# Patient Record
Sex: Female | Born: 1965 | Race: White | Hispanic: No | Marital: Married | Smoking: Never smoker
Health system: Southern US, Community
[De-identification: ages and names within clinical notes are randomized; demographics above are authoritative.]

## PROBLEM LIST (undated history)

## (undated) DIAGNOSIS — H43391 Other vitreous opacities, right eye: Secondary | ICD-10-CM

## (undated) DIAGNOSIS — F419 Anxiety disorder, unspecified: Secondary | ICD-10-CM

## (undated) DIAGNOSIS — M1990 Unspecified osteoarthritis, unspecified site: Secondary | ICD-10-CM

## (undated) DIAGNOSIS — K219 Gastro-esophageal reflux disease without esophagitis: Secondary | ICD-10-CM

## (undated) DIAGNOSIS — T7840XA Allergy, unspecified, initial encounter: Secondary | ICD-10-CM

## (undated) DIAGNOSIS — E785 Hyperlipidemia, unspecified: Secondary | ICD-10-CM

## (undated) DIAGNOSIS — J45909 Unspecified asthma, uncomplicated: Secondary | ICD-10-CM

## (undated) DIAGNOSIS — C801 Malignant (primary) neoplasm, unspecified: Secondary | ICD-10-CM

## (undated) DIAGNOSIS — F32A Depression, unspecified: Secondary | ICD-10-CM

## (undated) DIAGNOSIS — F329 Major depressive disorder, single episode, unspecified: Secondary | ICD-10-CM

## (undated) HISTORY — DX: Unspecified osteoarthritis, unspecified site: M19.90

## (undated) HISTORY — PX: OTHER SURGICAL HISTORY: SHX169

## (undated) HISTORY — DX: Hyperlipidemia, unspecified: E78.5

## (undated) HISTORY — DX: Anxiety disorder, unspecified: F41.9

## (undated) HISTORY — DX: Malignant (primary) neoplasm, unspecified: C80.1

## (undated) HISTORY — DX: Allergy, unspecified, initial encounter: T78.40XA

## (undated) HISTORY — DX: Gastro-esophageal reflux disease without esophagitis: K21.9

## (undated) HISTORY — DX: Depression, unspecified: F32.A

## (undated) HISTORY — PX: HYSTEROSCOPY: SHX211

## (undated) HISTORY — DX: Unspecified asthma, uncomplicated: J45.909

## (undated) HISTORY — DX: Major depressive disorder, single episode, unspecified: F32.9

---

## 1997-12-16 ENCOUNTER — Other Ambulatory Visit: Admission: RE | Admit: 1997-12-16 | Discharge: 1997-12-16 | Payer: Self-pay | Admitting: Obstetrics and Gynecology

## 1998-12-25 ENCOUNTER — Other Ambulatory Visit: Admission: RE | Admit: 1998-12-25 | Discharge: 1998-12-25 | Payer: Self-pay | Admitting: Obstetrics and Gynecology

## 2000-01-01 ENCOUNTER — Other Ambulatory Visit: Admission: RE | Admit: 2000-01-01 | Discharge: 2000-01-01 | Payer: Self-pay | Admitting: Obstetrics and Gynecology

## 2000-12-31 ENCOUNTER — Other Ambulatory Visit: Admission: RE | Admit: 2000-12-31 | Discharge: 2000-12-31 | Payer: Self-pay | Admitting: *Deleted

## 2008-05-30 ENCOUNTER — Inpatient Hospital Stay (HOSPITAL_COMMUNITY): Admission: AD | Admit: 2008-05-30 | Discharge: 2008-05-30 | Payer: Self-pay | Admitting: Obstetrics & Gynecology

## 2008-05-31 ENCOUNTER — Inpatient Hospital Stay (HOSPITAL_COMMUNITY): Admission: AD | Admit: 2008-05-31 | Discharge: 2008-05-31 | Payer: Self-pay | Admitting: Obstetrics

## 2008-06-20 ENCOUNTER — Inpatient Hospital Stay (HOSPITAL_COMMUNITY): Admission: AD | Admit: 2008-06-20 | Discharge: 2008-06-23 | Payer: Self-pay | Admitting: Obstetrics and Gynecology

## 2009-09-06 ENCOUNTER — Encounter: Admission: RE | Admit: 2009-09-06 | Discharge: 2009-09-06 | Payer: Self-pay | Admitting: Obstetrics

## 2010-09-18 ENCOUNTER — Encounter
Admission: RE | Admit: 2010-09-18 | Discharge: 2010-09-18 | Payer: Self-pay | Source: Home / Self Care | Attending: Obstetrics | Admitting: Obstetrics

## 2010-12-25 ENCOUNTER — Other Ambulatory Visit: Payer: Self-pay | Admitting: Orthopedic Surgery

## 2010-12-25 DIAGNOSIS — M545 Low back pain: Secondary | ICD-10-CM

## 2010-12-27 ENCOUNTER — Ambulatory Visit
Admission: RE | Admit: 2010-12-27 | Discharge: 2010-12-27 | Disposition: A | Discharge status: Home / Self Care | Payer: BC Managed Care – PPO | Source: Ambulatory Visit | Attending: Orthopedic Surgery | Admitting: Orthopedic Surgery

## 2010-12-27 DIAGNOSIS — M545 Low back pain: Secondary | ICD-10-CM

## 2011-01-22 NOTE — H&P (Signed)
Emily Ferguson, Emily Ferguson            ACCOUNT NO.:  [id number]   MEDICAL RECORD NO.:  [id number]          PATIENT TYPE:  INP   LOCATION:  9165                          FACILITY:  [facility]   PHYSICIAN:  Lenoard Aden, M.D.DATE OF BIRTH:  1965/11/08   DATE OF ADMISSION:  06/20/2008  DATE OF DISCHARGE:                              HISTORY & PHYSICAL   CHIEF COMPLAINT:  Spontaneous rupture of membranes at 35 weeks with  breech presentation and active labor.   She is a 45 year old white female G2, P0 at 14 weeks' gestation who  presents with spontaneous rupture of membranes today at 12:00 noon  confirmed a breech presentation and active contractions.   ALLERGIES:  She has allergies to LATEX.   MEDICAL HISTORY:  She has past medical history of asthma, reflux,  migraine headaches, infertility.   SURGICAL HISTORY:  Septum section done hysteroscopically.   SOCIAL HISTORY:  Noncontributory.   MEDICATIONS:  Protonix and prenatal vitamins.   FAMILY HISTORY:  Heart disease, stroke, interstitial cystitis and skin  cancer.   PHYSICAL EXAMINATION:  GENERAL:  She is well-developed, well-nourished  white female in no acute distress.  HEENT:  Normal.  LUNGS: Clear.  HEART:  Regular rhythm.  ABDOMEN:  Soft, gravid, and nontender.  __________bedside ultrasound  consistent with breech presentation __________ right maternal upper  quadrant.  CERVICAL:  Deferred.  EXTREMITIES:  No cords.  NEURO:  Nonfocal.  SKIN:  Intact.  Gross rupture of membranes confirmed by leakage of  amniotic fluid as done in the office.   IMPRESSION:  Spontaneous rupture of membranes, breech presentation,  active labor at 35 weeks.   PLAN:  Proceed with C-section.  Risks of anesthesia, infection,  bleeding, injury of the abdominal organs, need for further surgery were  discussed.  Delayed versus immediate complications to include bowel and  bladder injury noted.  The patient acknowledges and wishes to  proceed.      Lenoard Aden, M.D.  Electronically Signed     RJT/MEDQ  D:  06/20/2008  T:  06/21/2008  Job:  [id number]

## 2011-01-22 NOTE — Op Note (Signed)
Emily Ferguson, Emily Ferguson            ACCOUNT NO.:  [id number]   MEDICAL RECORD NO.:  [id number]          PATIENT TYPE:  INP   LOCATION:  9165                          FACILITY:  [facility]   PHYSICIAN:  Lenoard Aden, M.D.DATE OF BIRTH:  07/22/1966   DATE OF PROCEDURE:  DATE OF DISCHARGE:                               OPERATIVE REPORT   PREOPERATIVE DIAGNOSES:  35 weeks breech presentation, spontaneous  rupture of membranes.   POSTOPERATIVE DIAGNOSES:  35 weeks breech presentation, spontaneous  rupture of membranes.   PROCEDURE:  Primary low segment transverse cesarean section.   SURGEON:  Lenoard Aden, MD   ANESTHESIA:  Spinal by Raul Del, MD   ESTIMATED BLOOD LOSS:  1000 mL.   COMPLICATIONS:  None.   DRAINS:  Foley.   COUNTS:  Correct.   The patient recovered in good condition.   FINDINGS:  Full-term living frank breech fetus, Apgars 8 and 9, normal  uterus, normal ovaries, left paratubal cyst, normal intrauterine cavity,  2-layer closure.   BRIEF OPERATIVE NOTE:  We appraised the risks of anesthesia, infection,  bleeding, injury to abdominal organs, need for repair, delayed versus  immediate complications include bowel and bladder injury.  The patient  was brought to the operating room where she was administered a spinal  anesthetic without complications, prepped and draped in usual sterile  fashion.  Foley catheter placed achieving adequate anesthesia, dilute  Marcaine solution, placed beneath the skin incision made with the  scalpel, carried down the fascia which is nicked in the midline and  opened transversely using Mayo scissors.  Rectus muscles was separated  sharply in the midline.  Peritoneum entered sharply.  Bladder blade  placed.  Visceral peritoneum scored sharply off the lower uterine  segment.  Kerr hysterotomy incision made.  The atraumatic delivery of  full term living female, frank breech using the usual maneuvers with  flexion of the fetal  vertex, handed to pediatricians attendance.  Apgars  8 and 9.  Cord blood collected.  Placenta delivered manually intact from  an anterior location.  Uterus explored and found to be normal.  Uterus  was then curetted using a dry lap pack and closed in 2 running  imbricating layers of 0 Monocryl suture.  Bladder flap inspected, found to be hemostatic.  Irrigation  accomplished.  Fascia then reapproximated using 0 Monocryl in running  fashion and skin closed using staples.  The patient tolerated the  procedure well and transferred to recovery in good condition.      Lenoard Aden, M.D.  Electronically Signed     RJT/MEDQ  D:  06/20/2008  T:  06/21/2008  Job:  [id number]

## 2011-01-25 NOTE — Discharge Summary (Signed)
NAMEKANNA, DAFOE            ACCOUNT NO.:  [id number]   MEDICAL RECORD NO.:  [id number]          PATIENT TYPE:  INP   LOCATION:  9104                          FACILITY:  [facility]   PHYSICIAN:  Lenoard Aden, M.D.DATE OF BIRTH:  05/06/1966   DATE OF ADMISSION:  06/20/2008  DATE OF DISCHARGE:  06/23/2008                               DISCHARGE SUMMARY   The patient underwent uncomplicated primary C-section.  Postoperative  course uncomplicated.  Discharged home on postop day #3. Discharge  teaching done.   DISCHARGE MEDICATIONS:  1. Prenatal vitamins.  2. Iron.  3. Tylox.   FOLLOWUP:  Follow up in the office 4-6 weeks.      Lenoard Aden, M.D.  Electronically Signed     RJT/MEDQ  D:  08/02/2008  T:  08/02/2008  Job:  [id number]

## 2011-06-11 LAB — CBC
HCT: 31.8 — ABNORMAL LOW
Hemoglobin: 10.9 — ABNORMAL LOW
Hemoglobin: 12.8
MCHC: 33.5
Platelets: 158
RBC: 3.37 — ABNORMAL LOW
RBC: 4.11
WBC: 12 — ABNORMAL HIGH
WBC: 9.7

## 2011-06-11 LAB — RPR: RPR Ser Ql: NONREACTIVE

## 2011-09-11 ENCOUNTER — Other Ambulatory Visit: Payer: Self-pay | Admitting: Obstetrics

## 2011-09-11 DIAGNOSIS — Z1231 Encounter for screening mammogram for malignant neoplasm of breast: Secondary | ICD-10-CM

## 2011-10-15 ENCOUNTER — Ambulatory Visit
Admission: RE | Admit: 2011-10-15 | Discharge: 2011-10-15 | Disposition: A | Discharge status: Home / Self Care | Payer: BC Managed Care – PPO | Source: Ambulatory Visit | Attending: Obstetrics | Admitting: Obstetrics

## 2011-10-15 DIAGNOSIS — Z1231 Encounter for screening mammogram for malignant neoplasm of breast: Secondary | ICD-10-CM

## 2012-10-16 ENCOUNTER — Other Ambulatory Visit: Payer: Self-pay | Admitting: Obstetrics

## 2012-10-16 DIAGNOSIS — Z1231 Encounter for screening mammogram for malignant neoplasm of breast: Secondary | ICD-10-CM

## 2012-11-18 ENCOUNTER — Ambulatory Visit
Admission: RE | Admit: 2012-11-18 | Discharge: 2012-11-18 | Disposition: A | Discharge status: Home / Self Care | Payer: PRIVATE HEALTH INSURANCE | Source: Ambulatory Visit | Attending: Obstetrics | Admitting: Obstetrics

## 2013-11-15 ENCOUNTER — Other Ambulatory Visit: Payer: Self-pay

## 2013-11-15 DIAGNOSIS — Z1231 Encounter for screening mammogram for malignant neoplasm of breast: Secondary | ICD-10-CM

## 2013-12-03 ENCOUNTER — Ambulatory Visit
Admission: RE | Admit: 2013-12-03 | Discharge: 2013-12-03 | Disposition: A | Discharge status: Home / Self Care | Payer: PRIVATE HEALTH INSURANCE | Source: Ambulatory Visit

## 2013-12-03 DIAGNOSIS — Z1231 Encounter for screening mammogram for malignant neoplasm of breast: Secondary | ICD-10-CM

## 2014-04-08 ENCOUNTER — Ambulatory Visit
Admission: RE | Admit: 2014-04-08 | Discharge: 2014-04-08 | Disposition: A | Discharge status: Home / Self Care | Payer: BC Managed Care – PPO | Source: Ambulatory Visit | Attending: Sports Medicine | Admitting: Sports Medicine

## 2014-04-08 ENCOUNTER — Encounter: Payer: Self-pay | Admitting: Sports Medicine

## 2014-04-08 ENCOUNTER — Ambulatory Visit (INDEPENDENT_AMBULATORY_CARE_PROVIDER_SITE_OTHER): Payer: BC Managed Care – PPO | Admitting: Sports Medicine

## 2014-04-08 VITALS — BP 116/77 | HR 65 | Ht 67.0 in | Wt 136.0 lb

## 2014-04-08 DIAGNOSIS — R1032 Left lower quadrant pain: Secondary | ICD-10-CM

## 2014-04-08 DIAGNOSIS — M25569 Pain in unspecified knee: Secondary | ICD-10-CM

## 2014-04-08 DIAGNOSIS — M545 Low back pain, unspecified: Secondary | ICD-10-CM

## 2014-04-08 DIAGNOSIS — R109 Unspecified abdominal pain: Secondary | ICD-10-CM

## 2014-04-08 DIAGNOSIS — M25552 Pain in left hip: Secondary | ICD-10-CM

## 2014-04-08 DIAGNOSIS — M25559 Pain in unspecified hip: Secondary | ICD-10-CM

## 2014-04-08 DIAGNOSIS — M25562 Pain in left knee: Secondary | ICD-10-CM | POA: Insufficient documentation

## 2014-04-08 MED ORDER — METAXALONE 800 MG PO TABS: 800.0000 mg | ORAL_TABLET | Freq: Three times a day (TID) | ORAL | Status: DC | PRN

## 2014-04-08 NOTE — Progress Notes (Signed)
  Emily Ferguson - 48 y.o. female MRN [id number]  Date of birth: February 18, 1966    SUBJECTIVE:     48 year old healthy female presents today with several issues/complaints.  1) Left Groin Pain - Has been present since January. - Pain is intermittent and mild in severity (2-3/10).  She describes the pain as feeling like a cramp. Worse with lying down and sitting.  She states the pain often radiates down her left medial thigh and to the knee.   - No recent fall, trauma, injury.  2) Left knee pain - Has been present for years.  She states that recently worsened her the past 3 weeks after increasing her physical activity (she walking on regular basis). - Pain is located above the kneecap.  No joint line tenderness.  She reports some grinding with motion.  She also notes some popping and clicking.  She states that it often feels like it's going to give way especially when going up steps.  3) Low back pain - Has been present for years. - She had significant issues with her low back approximately 8 years ago.  At that time she had an MRI which revealed an annular tear at L4-L5. - Currently, she states that her low back bothers her intermittently.  She reports that her back often "gives out", typically 3-4 times a year.  After one to one and a half weeks it improves and she is back doing her regular activities. - She denies radicular symptoms (numbness, tingling).  ROS:     Per HPI  PERTINENT  PMH / PSH  / SH:  Past Medical, Surgical, and Social Hx Reviewed.   Pertinent Historical Findings include:  Prior annular tear ~ 8 years ago.   OBJECTIVE: BP 116/77  Pulse 65  Ht 5\' 7"  (1.702 m)  Wt 136 lb (61.689 kg)  BMI 21.30 kg/m2  Physical Exam:  Vital signs are reviewed. General: Well-appearing female in no acute distress. MSK: Knee: Left Normal to inspection with no erythema or effusion or obvious bony abnormalities. Palpation normal with no warmth, joint line tenderness, patellar  tenderness, or condyle tenderness. ROM full in flexion and extension and lower leg rotation. Ligaments with solid consistent endpoints including ACL, PCL, LCL, MCL. Negative Mcmurray's.  Non painful patellar compression. Patellar glide with minimal crepitus. Patellar and quadriceps tendons unremarkable. Quad strength 4/5.   Hip: Left ROM - Full ROM in all planes except ER which was slightly decreased.  Muscle Strength - Normal except abduction 4/5. Pelvic alignment unremarkable to inspection and palpation. Greater trochanter without tenderness to palpation. No tenderness over piriformis and greater trochanter. No pain with FADIR. + Pain with FABER. No SI joint tenderness.  Back Exam: Inspection: Normal Motion: Decreased ROM w/ extension/  Negative Straight leg raise. Good hamstring flexibility  X-rays of her lumbar spine, left hip, and left knee were all ordered and reviewed today. All films are unremarkable. No significant degenerative changes seen. Nothing acute on any films.  ASSESSMENT & PLAN: See problem based charting & AVS for pt instructions.

## 2014-04-08 NOTE — Assessment & Plan Note (Addendum)
  Muscle relaxant PRN for spasm. Refer to [facility]. Followup with me in 6 weeks

## 2014-04-08 NOTE — Assessment & Plan Note (Addendum)
Unclear etiology. Question iliopsoas tendinitis. Referral to [facility]. Followup with me in 6 weeks.

## 2014-04-08 NOTE — Assessment & Plan Note (Addendum)
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 Normal physical exam today. Referred to Emily Ferguson Followup in 6 weeks

## 2014-04-08 NOTE — Patient Instructions (Signed)
Await Dr. Alinda Money call about the xrays.  Schedule PT following his phone call.  Follow up in 6 weeks.

## 2014-04-11 ENCOUNTER — Telehealth: Payer: Self-pay | Admitting: *Deleted

## 2014-04-11 NOTE — Telephone Encounter (Signed)
Message copied by Laurey Arrow on Mon Apr 11, 2014  3:45 PM ------      Message from: Thurman Coyer      Created: Fri Apr 08, 2014  4:50 PM      Regarding: xrays       Please call her and tell her that the x-rays of her low back, hip, and knee are all normal. She has no arthritis and no significant degenerative disc disease. Tell her to go ahead and contact the physical therapist, Barbaraann Barthel, and set up her first appointment with him. She should followup with me in about 6 weeks.            ----- Message -----         From: Rad Results In Interface         Sent: 04/08/2014   1:19 PM           To: Thurman Coyer, DO                   ------

## 2014-04-14 ENCOUNTER — Telehealth: Payer: Self-pay | Admitting: *Deleted

## 2014-04-14 NOTE — Telephone Encounter (Signed)
Message copied by Laurey Arrow on Thu Apr 14, 2014  9:19 AM ------      Message from: Carolyne Littles      Created: Tue Apr 12, 2014  3:43 PM      Regarding: phone message      Contact: [phone]       Would like to discuss med (metaxalone) she said with her insurance it was over 200.00 so she is looking for something else.            She didn't know the name of the med, that was in her chart so I'm guessing that was the one we sent in. ------

## 2014-05-10 ENCOUNTER — Ambulatory Visit: Payer: PRIVATE HEALTH INSURANCE | Admitting: Sports Medicine

## 2014-05-23 ENCOUNTER — Ambulatory Visit: Payer: BC Managed Care – PPO | Admitting: Sports Medicine

## 2014-06-20 ENCOUNTER — Ambulatory Visit: Payer: BC Managed Care – PPO | Admitting: Sports Medicine

## 2014-06-27 ENCOUNTER — Ambulatory Visit (INDEPENDENT_AMBULATORY_CARE_PROVIDER_SITE_OTHER): Payer: BC Managed Care – PPO | Admitting: Sports Medicine

## 2014-06-27 ENCOUNTER — Encounter: Payer: Self-pay | Admitting: Sports Medicine

## 2014-06-27 VITALS — BP 111/71 | HR 65 | Ht 67.0 in | Wt 136.0 lb

## 2014-06-27 DIAGNOSIS — M545 Low back pain, unspecified: Secondary | ICD-10-CM

## 2014-06-27 DIAGNOSIS — M25552 Pain in left hip: Secondary | ICD-10-CM

## 2014-06-27 DIAGNOSIS — M25562 Pain in left knee: Secondary | ICD-10-CM

## 2014-06-27 MED ORDER — CYCLOBENZAPRINE HCL 5 MG PO TABS: 5.0000 mg | ORAL_TABLET | Freq: Every day | ORAL | Status: DC | PRN

## 2014-06-27 NOTE — Progress Notes (Signed)
  JANIECE SCOVILL - 48 y.o. female MRN [id number]  Date of birth: 08-10-1966    SUBJECTIVE:     Ms. Bodkins is a 48 yo F presenting for f/u of her right sided lumbago, left hip pain and left knee pain. She was referred to PT Barbaraann Barthel at previous appt.   Back: she reports feeling greatly improved. She was doing some heavy lifting yesterday and felt sore. She took an aleve and that resolved her symptoms. She denise any flare ups. She is 95% better since working with PT. She had some associated pain with her cycle but that is normal for her.   Left hip pain: she is 80-85% better since working with PT. Pain occurs on her left lateral hip. Localized in nature. The pain is occuring left frequently. She is not taking any medications. Sitting in certain situations exacerbates her pain so she avoids sitting in those positions. She had one flare and took her mother's 5 mg flexeril and that resolved her pain.   Left knee: having pain with moderate activity. Able to play tennis two times per week now. She was feeling pain with every third step prior to PT. The pain is not significant and feels it most under the knee cap. She reports being 80-85% better.   Denies any fevers, chills, night sweats. No weakness, numbness or tingling.   ROS:     See HPI   OBJECTIVE: BP 111/71  Pulse 65  Ht 5\' 7"  (1.702 m)  Wt 136 lb (61.689 kg)  BMI 21.30 kg/m2  Physical Exam:  Vital signs are reviewed. General: NAD, alert, well appearing female  Back:  Appearance: sciolosis no, no erythema or ecchymosis  Palpation: tenderness of paraspinal muscles no, spinous process no; pelvis no  Flexion: FROM  Extension: FROM Hip:  Appearance: symmetric, no erythema or ecchymosis  Palpation: tenderness of greater trochanter no Rotation Reduced: internal no, external no FADIR: pos on left,  FABER: nml b/l  Neuro: Strength hip flexion 5/5,knee extension 5/5, knee flexion 5/5, dorsiflexion 5/5, plantar flexion  5/5 Reflexes: patella 2/2 Bilateral   Straight Leg Raise: negative Sensation to light touch intact: yes Knee Exam:  Laterality: left Appearance: no effusion, erythema, ecchymosis  Edema: no   Tenderness: no   Range of Motion: Passive Extension: normal Flexion:normal Active Extension: normal Flexion: normal Laxity: none Maneuvers:  McMurray's: neg Patellar Compression: neg Strength:  Quadricep: 5/5 Hamstring: 5/5 Gait: normal Neurovascularly intact   ASSESSMENT & PLAN:  See problem based charting.

## 2014-06-27 NOTE — Assessment & Plan Note (Addendum)
Pain on lateral left hip. 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 Greatly improved with PT.  - f/u with PT in one month.  - f/u [facility] PRN

## 2014-06-27 NOTE — Assessment & Plan Note (Signed)
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 Improved with PT.  - continue home modalities.  - has f/u with PT in one month  - f/u [facility] PRN

## 2014-06-27 NOTE — Assessment & Plan Note (Signed)
Resolved since working with PT.  - graduated from PT with f/u in one month  - continue home modalities  - f/u PRN  - flexeril 5 mg QD PRN

## 2015-03-16 ENCOUNTER — Ambulatory Visit
Admission: RE | Admit: 2015-03-16 | Discharge: 2015-03-16 | Disposition: A | Discharge status: Home / Self Care | Payer: BLUE CROSS/BLUE SHIELD | Source: Ambulatory Visit | Attending: Allergy and Immunology | Admitting: Allergy and Immunology

## 2015-03-16 ENCOUNTER — Other Ambulatory Visit: Payer: Self-pay | Admitting: Allergy and Immunology

## 2015-03-16 DIAGNOSIS — J4520 Mild intermittent asthma, uncomplicated: Secondary | ICD-10-CM

## 2015-05-22 ENCOUNTER — Other Ambulatory Visit: Payer: Self-pay | Admitting: Otolaryngology

## 2015-05-22 DIAGNOSIS — R0981 Nasal congestion: Secondary | ICD-10-CM

## 2015-05-22 DIAGNOSIS — R51 Headache: Secondary | ICD-10-CM

## 2015-05-22 DIAGNOSIS — R519 Headache, unspecified: Secondary | ICD-10-CM

## 2015-05-26 ENCOUNTER — Ambulatory Visit
Admission: RE | Admit: 2015-05-26 | Discharge: 2015-05-26 | Disposition: A | Discharge status: Home / Self Care | Payer: BLUE CROSS/BLUE SHIELD | Source: Ambulatory Visit | Attending: Otolaryngology | Admitting: Otolaryngology

## 2015-05-26 DIAGNOSIS — R519 Headache, unspecified: Secondary | ICD-10-CM

## 2015-05-26 DIAGNOSIS — R51 Headache: Secondary | ICD-10-CM

## 2015-05-26 DIAGNOSIS — R0981 Nasal congestion: Secondary | ICD-10-CM

## 2016-02-19 DIAGNOSIS — J301 Allergic rhinitis due to pollen: Secondary | ICD-10-CM | POA: Diagnosis not present

## 2016-02-19 DIAGNOSIS — J3089 Other allergic rhinitis: Secondary | ICD-10-CM | POA: Diagnosis not present

## 2016-02-20 DIAGNOSIS — N920 Excessive and frequent menstruation with regular cycle: Secondary | ICD-10-CM | POA: Diagnosis not present

## 2016-02-20 DIAGNOSIS — N921 Excessive and frequent menstruation with irregular cycle: Secondary | ICD-10-CM | POA: Diagnosis not present

## 2016-03-01 DIAGNOSIS — J301 Allergic rhinitis due to pollen: Secondary | ICD-10-CM | POA: Diagnosis not present

## 2016-03-01 DIAGNOSIS — J3089 Other allergic rhinitis: Secondary | ICD-10-CM | POA: Diagnosis not present

## 2016-03-06 DIAGNOSIS — L710 Perioral dermatitis: Secondary | ICD-10-CM | POA: Diagnosis not present

## 2016-03-15 DIAGNOSIS — J301 Allergic rhinitis due to pollen: Secondary | ICD-10-CM | POA: Diagnosis not present

## 2016-03-15 DIAGNOSIS — J3089 Other allergic rhinitis: Secondary | ICD-10-CM | POA: Diagnosis not present

## 2016-03-20 DIAGNOSIS — J3089 Other allergic rhinitis: Secondary | ICD-10-CM | POA: Diagnosis not present

## 2016-03-20 DIAGNOSIS — J301 Allergic rhinitis due to pollen: Secondary | ICD-10-CM | POA: Diagnosis not present

## 2016-03-26 DIAGNOSIS — J301 Allergic rhinitis due to pollen: Secondary | ICD-10-CM | POA: Diagnosis not present

## 2016-03-26 DIAGNOSIS — J3089 Other allergic rhinitis: Secondary | ICD-10-CM | POA: Diagnosis not present

## 2016-03-27 DIAGNOSIS — L700 Acne vulgaris: Secondary | ICD-10-CM | POA: Diagnosis not present

## 2016-03-29 ENCOUNTER — Other Ambulatory Visit: Payer: Self-pay | Admitting: *Deleted

## 2016-03-29 DIAGNOSIS — M545 Low back pain, unspecified: Secondary | ICD-10-CM

## 2016-03-29 DIAGNOSIS — M25552 Pain in left hip: Secondary | ICD-10-CM

## 2016-03-29 MED ORDER — METAXALONE 800 MG PO TABS
800.0000 mg | ORAL_TABLET | Freq: Two times a day (BID) | ORAL | Status: DC | PRN
Start: 2016-03-29 — End: 2016-09-30

## 2016-03-29 MED ORDER — CYCLOBENZAPRINE HCL 5 MG PO TABS: 5.0000 mg | ORAL_TABLET | Freq: Every day | ORAL | Status: DC

## 2016-04-08 DIAGNOSIS — J301 Allergic rhinitis due to pollen: Secondary | ICD-10-CM | POA: Diagnosis not present

## 2016-04-08 DIAGNOSIS — J3089 Other allergic rhinitis: Secondary | ICD-10-CM | POA: Diagnosis not present

## 2016-04-11 DIAGNOSIS — J301 Allergic rhinitis due to pollen: Secondary | ICD-10-CM | POA: Diagnosis not present

## 2016-04-16 DIAGNOSIS — J301 Allergic rhinitis due to pollen: Secondary | ICD-10-CM | POA: Diagnosis not present

## 2016-04-16 DIAGNOSIS — J3089 Other allergic rhinitis: Secondary | ICD-10-CM | POA: Diagnosis not present

## 2016-05-06 DIAGNOSIS — J3089 Other allergic rhinitis: Secondary | ICD-10-CM | POA: Diagnosis not present

## 2016-05-06 DIAGNOSIS — J301 Allergic rhinitis due to pollen: Secondary | ICD-10-CM | POA: Diagnosis not present

## 2016-05-20 DIAGNOSIS — J039 Acute tonsillitis, unspecified: Secondary | ICD-10-CM | POA: Diagnosis not present

## 2016-05-20 DIAGNOSIS — J309 Allergic rhinitis, unspecified: Secondary | ICD-10-CM | POA: Diagnosis not present

## 2016-05-20 DIAGNOSIS — G43829 Menstrual migraine, not intractable, without status migrainosus: Secondary | ICD-10-CM | POA: Diagnosis not present

## 2016-05-20 DIAGNOSIS — J0101 Acute recurrent maxillary sinusitis: Secondary | ICD-10-CM | POA: Diagnosis not present

## 2016-05-20 DIAGNOSIS — Z9189 Other specified personal risk factors, not elsewhere classified: Secondary | ICD-10-CM | POA: Diagnosis not present

## 2016-05-22 DIAGNOSIS — J3089 Other allergic rhinitis: Secondary | ICD-10-CM | POA: Diagnosis not present

## 2016-05-22 DIAGNOSIS — J301 Allergic rhinitis due to pollen: Secondary | ICD-10-CM | POA: Diagnosis not present

## 2016-05-27 DIAGNOSIS — J3089 Other allergic rhinitis: Secondary | ICD-10-CM | POA: Diagnosis not present

## 2016-05-27 DIAGNOSIS — J301 Allergic rhinitis due to pollen: Secondary | ICD-10-CM | POA: Diagnosis not present

## 2016-05-28 DIAGNOSIS — J3089 Other allergic rhinitis: Secondary | ICD-10-CM | POA: Diagnosis not present

## 2016-06-04 DIAGNOSIS — J301 Allergic rhinitis due to pollen: Secondary | ICD-10-CM | POA: Diagnosis not present

## 2016-06-04 DIAGNOSIS — J3089 Other allergic rhinitis: Secondary | ICD-10-CM | POA: Diagnosis not present

## 2016-06-10 DIAGNOSIS — J3089 Other allergic rhinitis: Secondary | ICD-10-CM | POA: Diagnosis not present

## 2016-06-10 DIAGNOSIS — J301 Allergic rhinitis due to pollen: Secondary | ICD-10-CM | POA: Diagnosis not present

## 2016-06-12 DIAGNOSIS — J4530 Mild persistent asthma, uncomplicated: Secondary | ICD-10-CM | POA: Diagnosis not present

## 2016-06-12 DIAGNOSIS — J3089 Other allergic rhinitis: Secondary | ICD-10-CM | POA: Diagnosis not present

## 2016-06-12 DIAGNOSIS — J301 Allergic rhinitis due to pollen: Secondary | ICD-10-CM | POA: Diagnosis not present

## 2016-06-24 DIAGNOSIS — Z23 Encounter for immunization: Secondary | ICD-10-CM | POA: Diagnosis not present

## 2016-06-24 DIAGNOSIS — J3089 Other allergic rhinitis: Secondary | ICD-10-CM | POA: Diagnosis not present

## 2016-06-24 DIAGNOSIS — J301 Allergic rhinitis due to pollen: Secondary | ICD-10-CM | POA: Diagnosis not present

## 2016-07-01 DIAGNOSIS — J301 Allergic rhinitis due to pollen: Secondary | ICD-10-CM | POA: Diagnosis not present

## 2016-07-01 DIAGNOSIS — J3089 Other allergic rhinitis: Secondary | ICD-10-CM | POA: Diagnosis not present

## 2016-07-12 DIAGNOSIS — Z0000 Encounter for general adult medical examination without abnormal findings: Secondary | ICD-10-CM | POA: Diagnosis not present

## 2016-07-24 ENCOUNTER — Other Ambulatory Visit: Payer: Self-pay | Admitting: Internal Medicine

## 2016-07-24 DIAGNOSIS — R2232 Localized swelling, mass and lump, left upper limb: Secondary | ICD-10-CM | POA: Diagnosis not present

## 2016-07-24 DIAGNOSIS — J329 Chronic sinusitis, unspecified: Secondary | ICD-10-CM | POA: Diagnosis not present

## 2016-07-24 DIAGNOSIS — Z0000 Encounter for general adult medical examination without abnormal findings: Secondary | ICD-10-CM | POA: Diagnosis not present

## 2016-07-24 DIAGNOSIS — N6320 Unspecified lump in the left breast, unspecified quadrant: Secondary | ICD-10-CM

## 2016-07-30 ENCOUNTER — Ambulatory Visit
Admission: RE | Admit: 2016-07-30 | Discharge: 2016-07-30 | Disposition: A | Discharge status: Home / Self Care | Payer: BLUE CROSS/BLUE SHIELD | Source: Ambulatory Visit | Attending: Internal Medicine | Admitting: Internal Medicine

## 2016-07-30 ENCOUNTER — Other Ambulatory Visit: Payer: Self-pay | Admitting: Internal Medicine

## 2016-07-30 DIAGNOSIS — N6320 Unspecified lump in the left breast, unspecified quadrant: Secondary | ICD-10-CM

## 2016-07-30 DIAGNOSIS — R590 Localized enlarged lymph nodes: Secondary | ICD-10-CM

## 2016-08-07 DIAGNOSIS — J301 Allergic rhinitis due to pollen: Secondary | ICD-10-CM | POA: Diagnosis not present

## 2016-08-07 DIAGNOSIS — J3089 Other allergic rhinitis: Secondary | ICD-10-CM | POA: Diagnosis not present

## 2016-08-09 DIAGNOSIS — J3089 Other allergic rhinitis: Secondary | ICD-10-CM | POA: Diagnosis not present

## 2016-08-09 DIAGNOSIS — J301 Allergic rhinitis due to pollen: Secondary | ICD-10-CM | POA: Diagnosis not present

## 2016-08-12 DIAGNOSIS — J3089 Other allergic rhinitis: Secondary | ICD-10-CM | POA: Diagnosis not present

## 2016-08-12 DIAGNOSIS — J301 Allergic rhinitis due to pollen: Secondary | ICD-10-CM | POA: Diagnosis not present

## 2016-08-13 DIAGNOSIS — N920 Excessive and frequent menstruation with regular cycle: Secondary | ICD-10-CM | POA: Diagnosis not present

## 2016-08-19 DIAGNOSIS — J301 Allergic rhinitis due to pollen: Secondary | ICD-10-CM | POA: Diagnosis not present

## 2016-08-19 DIAGNOSIS — J3089 Other allergic rhinitis: Secondary | ICD-10-CM | POA: Diagnosis not present

## 2016-08-22 DIAGNOSIS — J301 Allergic rhinitis due to pollen: Secondary | ICD-10-CM | POA: Diagnosis not present

## 2016-08-22 DIAGNOSIS — J3089 Other allergic rhinitis: Secondary | ICD-10-CM | POA: Diagnosis not present

## 2016-09-06 DIAGNOSIS — J301 Allergic rhinitis due to pollen: Secondary | ICD-10-CM | POA: Diagnosis not present

## 2016-09-06 DIAGNOSIS — J3089 Other allergic rhinitis: Secondary | ICD-10-CM | POA: Diagnosis not present

## 2016-09-13 DIAGNOSIS — J301 Allergic rhinitis due to pollen: Secondary | ICD-10-CM | POA: Diagnosis not present

## 2016-09-13 DIAGNOSIS — J3089 Other allergic rhinitis: Secondary | ICD-10-CM | POA: Diagnosis not present

## 2016-09-18 DIAGNOSIS — J3089 Other allergic rhinitis: Secondary | ICD-10-CM | POA: Diagnosis not present

## 2016-09-18 DIAGNOSIS — M542 Cervicalgia: Secondary | ICD-10-CM | POA: Diagnosis not present

## 2016-09-18 DIAGNOSIS — J301 Allergic rhinitis due to pollen: Secondary | ICD-10-CM | POA: Diagnosis not present

## 2016-09-19 ENCOUNTER — Other Ambulatory Visit: Payer: Self-pay | Admitting: *Deleted

## 2016-09-19 DIAGNOSIS — G8929 Other chronic pain: Secondary | ICD-10-CM

## 2016-09-19 DIAGNOSIS — M545 Low back pain: Principal | ICD-10-CM

## 2016-09-24 DIAGNOSIS — M542 Cervicalgia: Secondary | ICD-10-CM | POA: Diagnosis not present

## 2016-09-26 ENCOUNTER — Ambulatory Visit: Payer: BLUE CROSS/BLUE SHIELD | Admitting: Sports Medicine

## 2016-09-30 ENCOUNTER — Ambulatory Visit (INDEPENDENT_AMBULATORY_CARE_PROVIDER_SITE_OTHER): Payer: BLUE CROSS/BLUE SHIELD | Admitting: Sports Medicine

## 2016-09-30 ENCOUNTER — Encounter: Payer: Self-pay | Admitting: Sports Medicine

## 2016-09-30 DIAGNOSIS — G8929 Other chronic pain: Secondary | ICD-10-CM

## 2016-09-30 DIAGNOSIS — M62838 Other muscle spasm: Secondary | ICD-10-CM

## 2016-09-30 DIAGNOSIS — M545 Low back pain, unspecified: Secondary | ICD-10-CM

## 2016-09-30 MED ORDER — CYCLOBENZAPRINE HCL 5 MG PO TABS: 5.0000 mg | ORAL_TABLET | Freq: Every day | ORAL | 0 refills | Status: DC

## 2016-09-30 MED ORDER — METAXALONE 800 MG PO TABS: 800.0000 mg | ORAL_TABLET | Freq: Two times a day (BID) | ORAL | 0 refills | Status: DC | PRN

## 2016-09-30 NOTE — Progress Notes (Signed)
   Subjective:    Patient ID: Delos Haring, female    DOB: [birth year], 51 y.o.   MRN: [id number]  HPI chief complaint: Med refills  51 year old female comes in today for refills on both Flexeril and Skelaxin. She takes both of these intermittently. She takes Flexeril for severe spasm and Skelaxin for mild to moderate spasm of her low back. She does not take them together. She still has several pills left over from her last refill. She has a history of intermittent low back pain and left hip pain which has responded in the past to formal physical therapy. She has restarted physical therapy with Barbaraann Barthel and that is helping. For the most part she is able to stay active without any problem but will occasionally get some spasm in her low back. Her spasms respond very well to intermittent use of both Flexeril and Skelaxin. She denies any numbness or tingling. No nighttime pain. No weight loss. No fevers or chills. No trauma.  Interim medical history reviewed Medications reviewed Allergies reviewed    Review of Systems    as above Objective:   Physical Exam  Well-developed, well-nourished. No acute distress. Awake alert and oriented 3. Vital signs reviewed  Lumbar spine: Full painless lumbar range of motion. No spasm. No tenderness to palpation.  Left hip: Smooth painless hip range of motion with a negative logroll. No tenderness to palpation along the lateral hip.  Neurological exam: No focal neurological deficit of either lower extremity      Assessment & Plan:   Intermittent low back spasm  Patient is using her Flexeril and Skelaxin responsibly. I've given her refills on both of these. She understands not to take them together. She understands the importance of continuing with her home exercises once she is discharged from physical therapy. I think she is okay to continue with activity as tolerated using pain as her guide. I do not see the need for diagnostic imaging at  this time. Patient will follow-up as needed.

## 2016-10-01 DIAGNOSIS — J301 Allergic rhinitis due to pollen: Secondary | ICD-10-CM | POA: Diagnosis not present

## 2016-10-01 DIAGNOSIS — J3089 Other allergic rhinitis: Secondary | ICD-10-CM | POA: Diagnosis not present

## 2016-10-01 DIAGNOSIS — M542 Cervicalgia: Secondary | ICD-10-CM | POA: Diagnosis not present

## 2016-10-02 DIAGNOSIS — N920 Excessive and frequent menstruation with regular cycle: Secondary | ICD-10-CM | POA: Diagnosis not present

## 2016-10-02 DIAGNOSIS — Z3202 Encounter for pregnancy test, result negative: Secondary | ICD-10-CM | POA: Diagnosis not present

## 2016-10-02 DIAGNOSIS — Z30430 Encounter for insertion of intrauterine contraceptive device: Secondary | ICD-10-CM | POA: Diagnosis not present

## 2016-10-08 DIAGNOSIS — R109 Unspecified abdominal pain: Secondary | ICD-10-CM | POA: Diagnosis not present

## 2016-10-14 DIAGNOSIS — J301 Allergic rhinitis due to pollen: Secondary | ICD-10-CM | POA: Diagnosis not present

## 2016-10-14 DIAGNOSIS — J3089 Other allergic rhinitis: Secondary | ICD-10-CM | POA: Diagnosis not present

## 2016-10-30 DIAGNOSIS — J4520 Mild intermittent asthma, uncomplicated: Secondary | ICD-10-CM | POA: Diagnosis not present

## 2016-10-30 DIAGNOSIS — J111 Influenza due to unidentified influenza virus with other respiratory manifestations: Secondary | ICD-10-CM | POA: Diagnosis not present

## 2016-11-04 DIAGNOSIS — J3089 Other allergic rhinitis: Secondary | ICD-10-CM | POA: Diagnosis not present

## 2016-11-04 DIAGNOSIS — J301 Allergic rhinitis due to pollen: Secondary | ICD-10-CM | POA: Diagnosis not present

## 2016-11-21 DIAGNOSIS — J301 Allergic rhinitis due to pollen: Secondary | ICD-10-CM | POA: Diagnosis not present

## 2016-11-21 DIAGNOSIS — J3089 Other allergic rhinitis: Secondary | ICD-10-CM | POA: Diagnosis not present

## 2016-12-04 DIAGNOSIS — J3089 Other allergic rhinitis: Secondary | ICD-10-CM | POA: Diagnosis not present

## 2016-12-04 DIAGNOSIS — J301 Allergic rhinitis due to pollen: Secondary | ICD-10-CM | POA: Diagnosis not present

## 2016-12-18 DIAGNOSIS — J3089 Other allergic rhinitis: Secondary | ICD-10-CM | POA: Diagnosis not present

## 2016-12-18 DIAGNOSIS — J301 Allergic rhinitis due to pollen: Secondary | ICD-10-CM | POA: Diagnosis not present

## 2016-12-23 DIAGNOSIS — C4441 Basal cell carcinoma of skin of scalp and neck: Secondary | ICD-10-CM | POA: Diagnosis not present

## 2016-12-23 DIAGNOSIS — D225 Melanocytic nevi of trunk: Secondary | ICD-10-CM | POA: Diagnosis not present

## 2016-12-23 DIAGNOSIS — D2262 Melanocytic nevi of left upper limb, including shoulder: Secondary | ICD-10-CM | POA: Diagnosis not present

## 2016-12-23 DIAGNOSIS — D2261 Melanocytic nevi of right upper limb, including shoulder: Secondary | ICD-10-CM | POA: Diagnosis not present

## 2016-12-23 DIAGNOSIS — C44319 Basal cell carcinoma of skin of other parts of face: Secondary | ICD-10-CM | POA: Diagnosis not present

## 2016-12-23 DIAGNOSIS — L821 Other seborrheic keratosis: Secondary | ICD-10-CM | POA: Diagnosis not present

## 2017-01-01 DIAGNOSIS — J4521 Mild intermittent asthma with (acute) exacerbation: Secondary | ICD-10-CM | POA: Diagnosis not present

## 2017-01-01 DIAGNOSIS — R5383 Other fatigue: Secondary | ICD-10-CM | POA: Diagnosis not present

## 2017-01-01 DIAGNOSIS — J0100 Acute maxillary sinusitis, unspecified: Secondary | ICD-10-CM | POA: Diagnosis not present

## 2017-01-06 DIAGNOSIS — C44319 Basal cell carcinoma of skin of other parts of face: Secondary | ICD-10-CM | POA: Diagnosis not present

## 2017-01-21 ENCOUNTER — Other Ambulatory Visit: Payer: Self-pay | Admitting: Internal Medicine

## 2017-01-21 DIAGNOSIS — N63 Unspecified lump in unspecified breast: Secondary | ICD-10-CM

## 2017-01-24 ENCOUNTER — Other Ambulatory Visit: Payer: Self-pay | Admitting: Allergy and Immunology

## 2017-01-24 ENCOUNTER — Ambulatory Visit
Admission: RE | Admit: 2017-01-24 | Discharge: 2017-01-24 | Disposition: A | Discharge status: Home / Self Care | Payer: BLUE CROSS/BLUE SHIELD | Source: Ambulatory Visit | Attending: Allergy and Immunology | Admitting: Allergy and Immunology

## 2017-01-24 DIAGNOSIS — J4541 Moderate persistent asthma with (acute) exacerbation: Secondary | ICD-10-CM

## 2017-01-29 ENCOUNTER — Ambulatory Visit
Admission: RE | Admit: 2017-01-29 | Discharge: 2017-01-29 | Disposition: A | Discharge status: Home / Self Care | Payer: BLUE CROSS/BLUE SHIELD | Source: Ambulatory Visit | Attending: Internal Medicine | Admitting: Internal Medicine

## 2017-01-29 DIAGNOSIS — N63 Unspecified lump in unspecified breast: Secondary | ICD-10-CM

## 2017-01-31 ENCOUNTER — Telehealth: Payer: Self-pay | Admitting: *Deleted

## 2017-01-31 MED ORDER — METHOCARBAMOL 750 MG PO TABS: 750.0000 mg | ORAL_TABLET | Freq: Three times a day (TID) | ORAL | 0 refills | Status: DC | PRN

## 2017-01-31 NOTE — Telephone Encounter (Signed)
New medication called in to replace Skelaxin

## 2017-05-08 DIAGNOSIS — J3089 Other allergic rhinitis: Secondary | ICD-10-CM | POA: Diagnosis not present

## 2017-05-08 DIAGNOSIS — J301 Allergic rhinitis due to pollen: Secondary | ICD-10-CM | POA: Diagnosis not present

## 2017-05-28 DIAGNOSIS — J329 Chronic sinusitis, unspecified: Secondary | ICD-10-CM | POA: Diagnosis not present

## 2017-06-19 DIAGNOSIS — J4530 Mild persistent asthma, uncomplicated: Secondary | ICD-10-CM | POA: Diagnosis not present

## 2017-06-19 DIAGNOSIS — J301 Allergic rhinitis due to pollen: Secondary | ICD-10-CM | POA: Diagnosis not present

## 2017-06-19 DIAGNOSIS — Z23 Encounter for immunization: Secondary | ICD-10-CM | POA: Diagnosis not present

## 2017-06-19 DIAGNOSIS — J3089 Other allergic rhinitis: Secondary | ICD-10-CM | POA: Diagnosis not present

## 2017-07-25 DIAGNOSIS — Z0000 Encounter for general adult medical examination without abnormal findings: Secondary | ICD-10-CM | POA: Diagnosis not present

## 2017-07-29 DIAGNOSIS — Z1151 Encounter for screening for human papillomavirus (HPV): Secondary | ICD-10-CM | POA: Diagnosis not present

## 2017-07-29 DIAGNOSIS — Z01419 Encounter for gynecological examination (general) (routine) without abnormal findings: Secondary | ICD-10-CM | POA: Diagnosis not present

## 2017-07-29 DIAGNOSIS — Z6821 Body mass index (BMI) 21.0-21.9, adult: Secondary | ICD-10-CM | POA: Diagnosis not present

## 2017-07-30 DIAGNOSIS — T753XXA Motion sickness, initial encounter: Secondary | ICD-10-CM | POA: Diagnosis not present

## 2017-07-30 DIAGNOSIS — Z9189 Other specified personal risk factors, not elsewhere classified: Secondary | ICD-10-CM | POA: Diagnosis not present

## 2017-07-30 DIAGNOSIS — Z0000 Encounter for general adult medical examination without abnormal findings: Secondary | ICD-10-CM | POA: Diagnosis not present

## 2017-07-30 DIAGNOSIS — Z23 Encounter for immunization: Secondary | ICD-10-CM | POA: Diagnosis not present

## 2017-08-05 DIAGNOSIS — T50A95A Adverse effect of other bacterial vaccines, initial encounter: Secondary | ICD-10-CM | POA: Diagnosis not present

## 2017-09-30 DIAGNOSIS — M545 Low back pain: Secondary | ICD-10-CM | POA: Diagnosis not present

## 2017-09-30 DIAGNOSIS — M542 Cervicalgia: Secondary | ICD-10-CM | POA: Diagnosis not present

## 2017-10-18 ENCOUNTER — Other Ambulatory Visit: Payer: Self-pay | Admitting: Sports Medicine

## 2017-10-20 DIAGNOSIS — M545 Low back pain: Secondary | ICD-10-CM | POA: Diagnosis not present

## 2017-10-20 DIAGNOSIS — M542 Cervicalgia: Secondary | ICD-10-CM | POA: Diagnosis not present

## 2017-10-27 DIAGNOSIS — M542 Cervicalgia: Secondary | ICD-10-CM | POA: Diagnosis not present

## 2017-10-27 DIAGNOSIS — M545 Low back pain: Secondary | ICD-10-CM | POA: Diagnosis not present

## 2017-11-06 DIAGNOSIS — M545 Low back pain: Secondary | ICD-10-CM | POA: Diagnosis not present

## 2017-11-06 DIAGNOSIS — M542 Cervicalgia: Secondary | ICD-10-CM | POA: Diagnosis not present

## 2017-11-14 DIAGNOSIS — M545 Low back pain: Secondary | ICD-10-CM | POA: Diagnosis not present

## 2017-11-14 DIAGNOSIS — M542 Cervicalgia: Secondary | ICD-10-CM | POA: Diagnosis not present

## 2017-11-20 DIAGNOSIS — F321 Major depressive disorder, single episode, moderate: Secondary | ICD-10-CM | POA: Diagnosis not present

## 2017-11-20 DIAGNOSIS — N951 Menopausal and female climacteric states: Secondary | ICD-10-CM | POA: Diagnosis not present

## 2017-12-24 DIAGNOSIS — D1801 Hemangioma of skin and subcutaneous tissue: Secondary | ICD-10-CM | POA: Diagnosis not present

## 2017-12-24 DIAGNOSIS — L821 Other seborrheic keratosis: Secondary | ICD-10-CM | POA: Diagnosis not present

## 2017-12-24 DIAGNOSIS — Z85828 Personal history of other malignant neoplasm of skin: Secondary | ICD-10-CM | POA: Diagnosis not present

## 2017-12-24 DIAGNOSIS — D2272 Melanocytic nevi of left lower limb, including hip: Secondary | ICD-10-CM | POA: Diagnosis not present

## 2018-01-08 DIAGNOSIS — Z3200 Encounter for pregnancy test, result unknown: Secondary | ICD-10-CM | POA: Diagnosis not present

## 2018-01-08 DIAGNOSIS — L292 Pruritus vulvae: Secondary | ICD-10-CM | POA: Diagnosis not present

## 2018-01-08 DIAGNOSIS — M2550 Pain in unspecified joint: Secondary | ICD-10-CM | POA: Diagnosis not present

## 2018-01-08 DIAGNOSIS — M79643 Pain in unspecified hand: Secondary | ICD-10-CM | POA: Diagnosis not present

## 2018-01-08 DIAGNOSIS — R5383 Other fatigue: Secondary | ICD-10-CM | POA: Diagnosis not present

## 2018-01-08 DIAGNOSIS — N950 Postmenopausal bleeding: Secondary | ICD-10-CM | POA: Diagnosis not present

## 2018-01-15 DIAGNOSIS — M19041 Primary osteoarthritis, right hand: Secondary | ICD-10-CM | POA: Diagnosis not present

## 2018-01-15 DIAGNOSIS — M2550 Pain in unspecified joint: Secondary | ICD-10-CM | POA: Diagnosis not present

## 2018-01-15 DIAGNOSIS — M1990 Unspecified osteoarthritis, unspecified site: Secondary | ICD-10-CM | POA: Diagnosis not present

## 2018-01-15 DIAGNOSIS — M064 Inflammatory polyarthropathy: Secondary | ICD-10-CM | POA: Diagnosis not present

## 2018-01-15 DIAGNOSIS — M25521 Pain in right elbow: Secondary | ICD-10-CM | POA: Diagnosis not present

## 2018-01-15 DIAGNOSIS — M79642 Pain in left hand: Secondary | ICD-10-CM | POA: Diagnosis not present

## 2018-01-15 DIAGNOSIS — M79643 Pain in unspecified hand: Secondary | ICD-10-CM | POA: Diagnosis not present

## 2018-01-15 DIAGNOSIS — M79641 Pain in right hand: Secondary | ICD-10-CM | POA: Diagnosis not present

## 2018-01-15 DIAGNOSIS — M19042 Primary osteoarthritis, left hand: Secondary | ICD-10-CM | POA: Diagnosis not present

## 2018-02-03 DIAGNOSIS — M2550 Pain in unspecified joint: Secondary | ICD-10-CM | POA: Diagnosis not present

## 2018-02-03 DIAGNOSIS — M79643 Pain in unspecified hand: Secondary | ICD-10-CM | POA: Diagnosis not present

## 2018-02-03 DIAGNOSIS — M797 Fibromyalgia: Secondary | ICD-10-CM | POA: Diagnosis not present

## 2018-02-03 DIAGNOSIS — M7710 Lateral epicondylitis, unspecified elbow: Secondary | ICD-10-CM | POA: Diagnosis not present

## 2018-02-16 DIAGNOSIS — N950 Postmenopausal bleeding: Secondary | ICD-10-CM | POA: Diagnosis not present

## 2018-03-06 ENCOUNTER — Other Ambulatory Visit: Payer: Self-pay | Admitting: Obstetrics

## 2018-03-06 DIAGNOSIS — Z1231 Encounter for screening mammogram for malignant neoplasm of breast: Secondary | ICD-10-CM

## 2018-03-10 ENCOUNTER — Ambulatory Visit
Admission: RE | Admit: 2018-03-10 | Discharge: 2018-03-10 | Disposition: A | Discharge status: Home / Self Care | Payer: BLUE CROSS/BLUE SHIELD | Source: Ambulatory Visit | Attending: Obstetrics | Admitting: Obstetrics

## 2018-03-10 DIAGNOSIS — Z1231 Encounter for screening mammogram for malignant neoplasm of breast: Secondary | ICD-10-CM

## 2018-05-18 DIAGNOSIS — J309 Allergic rhinitis, unspecified: Secondary | ICD-10-CM | POA: Diagnosis not present

## 2018-05-18 DIAGNOSIS — J019 Acute sinusitis, unspecified: Secondary | ICD-10-CM | POA: Diagnosis not present

## 2018-06-13 DIAGNOSIS — Z23 Encounter for immunization: Secondary | ICD-10-CM | POA: Diagnosis not present

## 2018-06-18 DIAGNOSIS — J301 Allergic rhinitis due to pollen: Secondary | ICD-10-CM | POA: Diagnosis not present

## 2018-06-18 DIAGNOSIS — F321 Major depressive disorder, single episode, moderate: Secondary | ICD-10-CM | POA: Diagnosis not present

## 2018-06-18 DIAGNOSIS — J4530 Mild persistent asthma, uncomplicated: Secondary | ICD-10-CM | POA: Diagnosis not present

## 2018-06-18 DIAGNOSIS — N950 Postmenopausal bleeding: Secondary | ICD-10-CM | POA: Diagnosis not present

## 2018-06-18 DIAGNOSIS — J3089 Other allergic rhinitis: Secondary | ICD-10-CM | POA: Diagnosis not present

## 2018-07-28 DIAGNOSIS — Z0000 Encounter for general adult medical examination without abnormal findings: Secondary | ICD-10-CM | POA: Diagnosis not present

## 2018-08-04 DIAGNOSIS — Z0000 Encounter for general adult medical examination without abnormal findings: Secondary | ICD-10-CM | POA: Diagnosis not present

## 2018-08-11 DIAGNOSIS — N920 Excessive and frequent menstruation with regular cycle: Secondary | ICD-10-CM | POA: Diagnosis not present

## 2018-08-11 DIAGNOSIS — Z01419 Encounter for gynecological examination (general) (routine) without abnormal findings: Secondary | ICD-10-CM | POA: Diagnosis not present

## 2018-08-11 DIAGNOSIS — Z9189 Other specified personal risk factors, not elsewhere classified: Secondary | ICD-10-CM | POA: Diagnosis not present

## 2018-08-11 DIAGNOSIS — Z1151 Encounter for screening for human papillomavirus (HPV): Secondary | ICD-10-CM | POA: Diagnosis not present

## 2018-08-11 DIAGNOSIS — Z3202 Encounter for pregnancy test, result negative: Secondary | ICD-10-CM | POA: Diagnosis not present

## 2018-08-11 DIAGNOSIS — Z6822 Body mass index (BMI) 22.0-22.9, adult: Secondary | ICD-10-CM | POA: Diagnosis not present

## 2018-08-11 DIAGNOSIS — Z124 Encounter for screening for malignant neoplasm of cervix: Secondary | ICD-10-CM | POA: Diagnosis not present

## 2018-08-11 DIAGNOSIS — Z113 Encounter for screening for infections with a predominantly sexual mode of transmission: Secondary | ICD-10-CM | POA: Diagnosis not present

## 2018-10-15 IMAGING — MG 2D DIGITAL DIAGNOSTIC UNILATERAL LEFT MAMMOGRAM WITH CAD AND ADJ
6 of 9 series · 6 of 21 positions shown · non-contrast
Comparison: Previous exam(s).

CLINICAL DATA: Palpable lump in the left axilla felt by the
patient's physician. Superior to the lump felt by the patient's
physician is a another smaller lump felt by the patient. The more
superior lump waxes and wanes.

EXAM:
2D DIGITAL DIAGNOSTIC LEFT MAMMOGRAM WITH CAD AND ADJUNCT TOMO
ULTRASOUND LEFT BREAST

[L CC synth-2D]
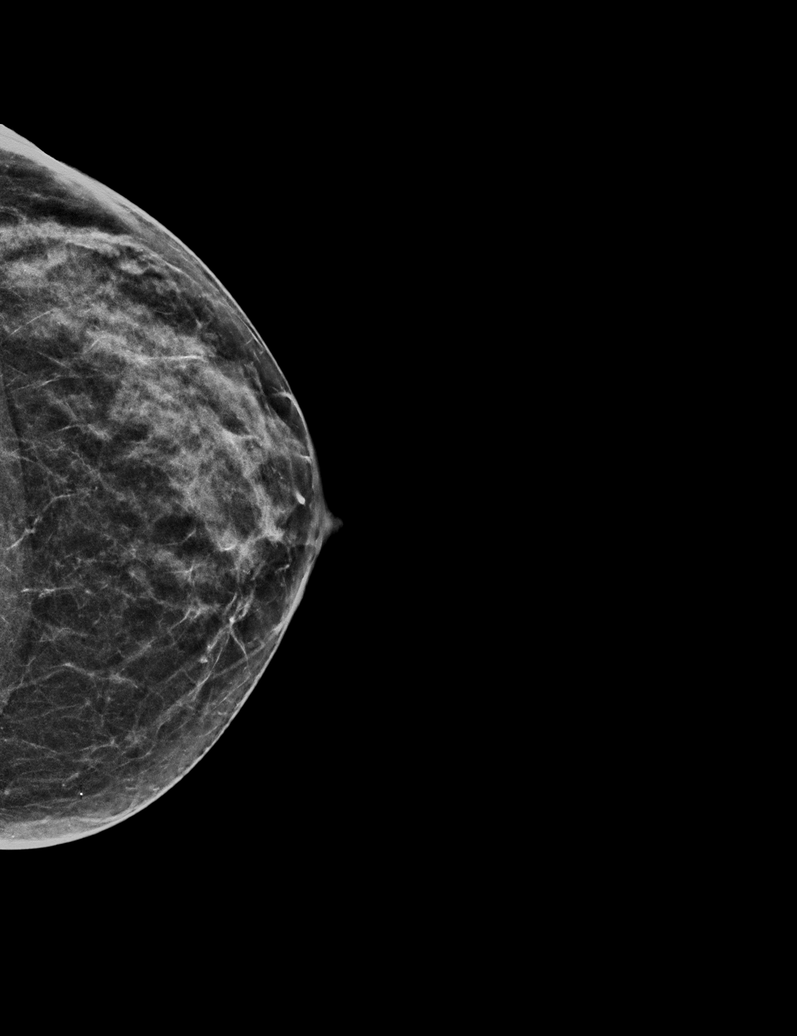

[L TAN]
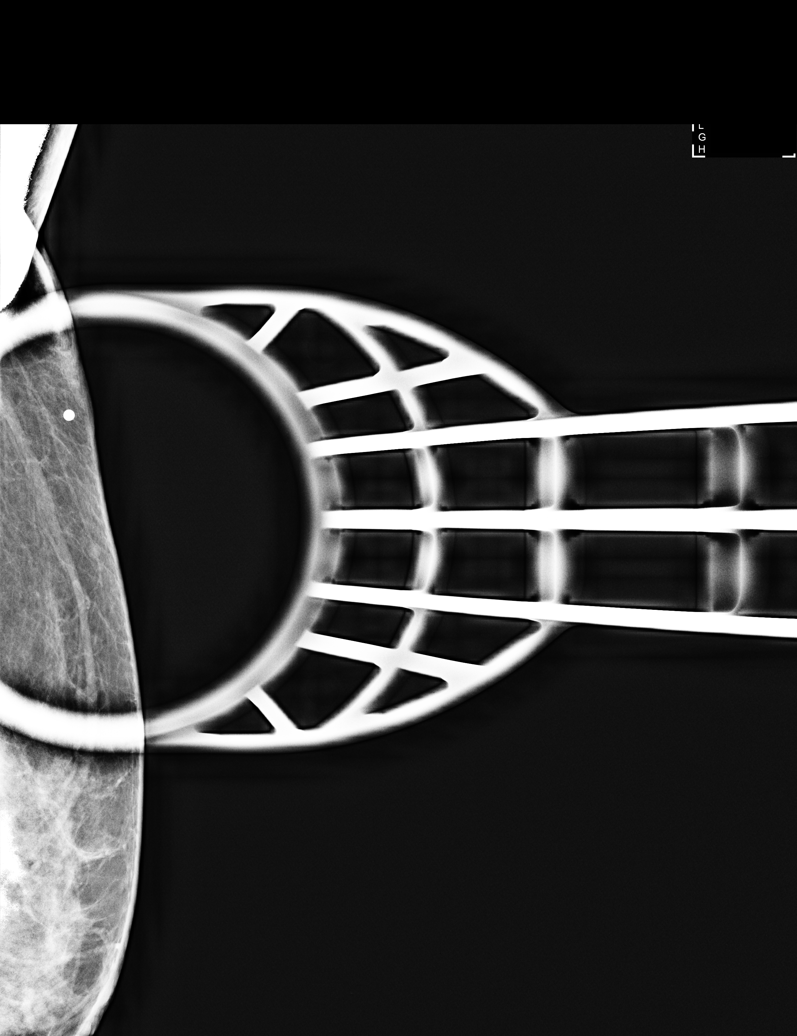

[L MLO synth-2D]
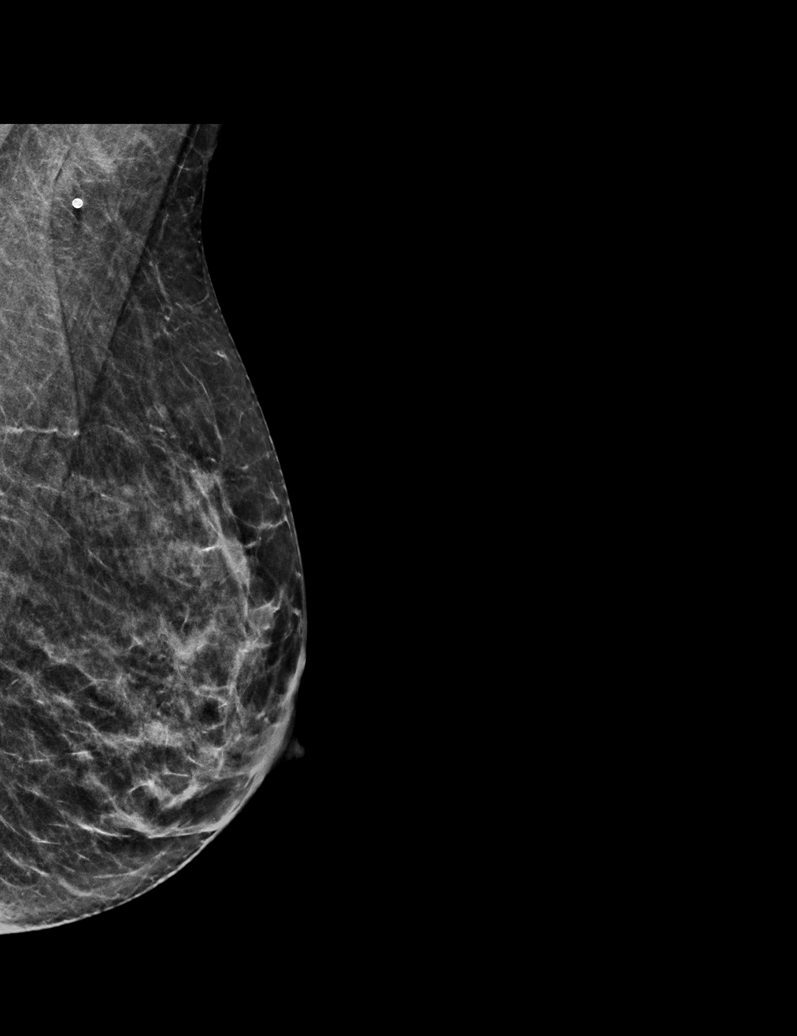

[L CC]
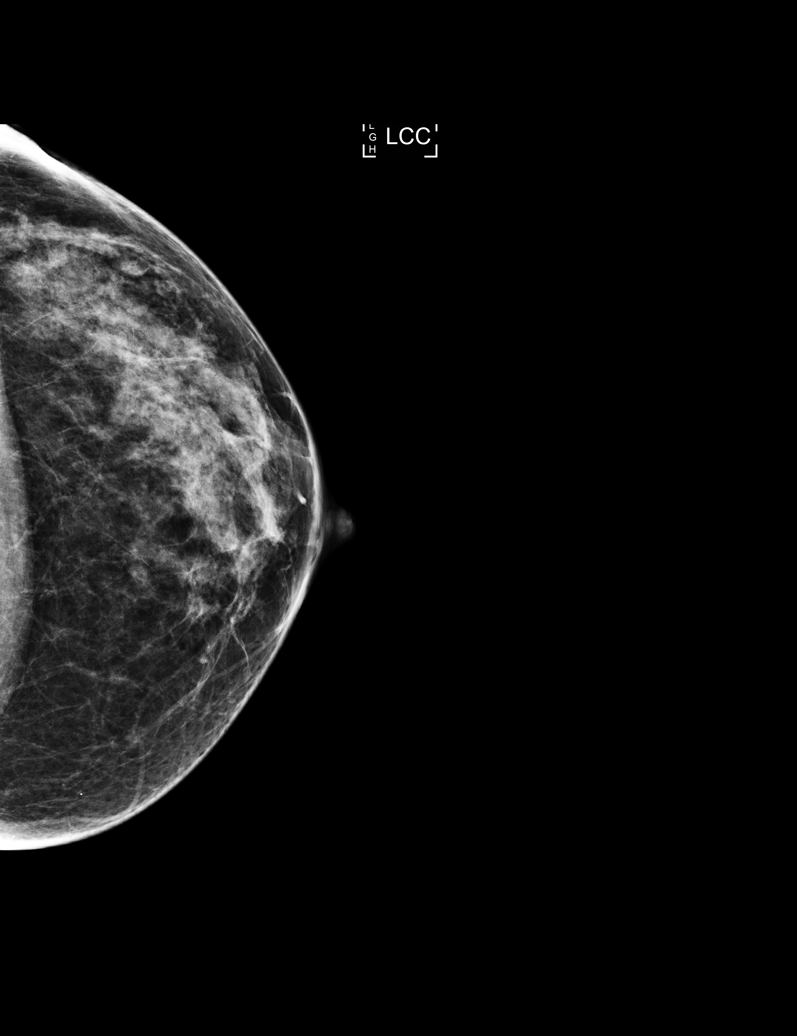

[L MLO]
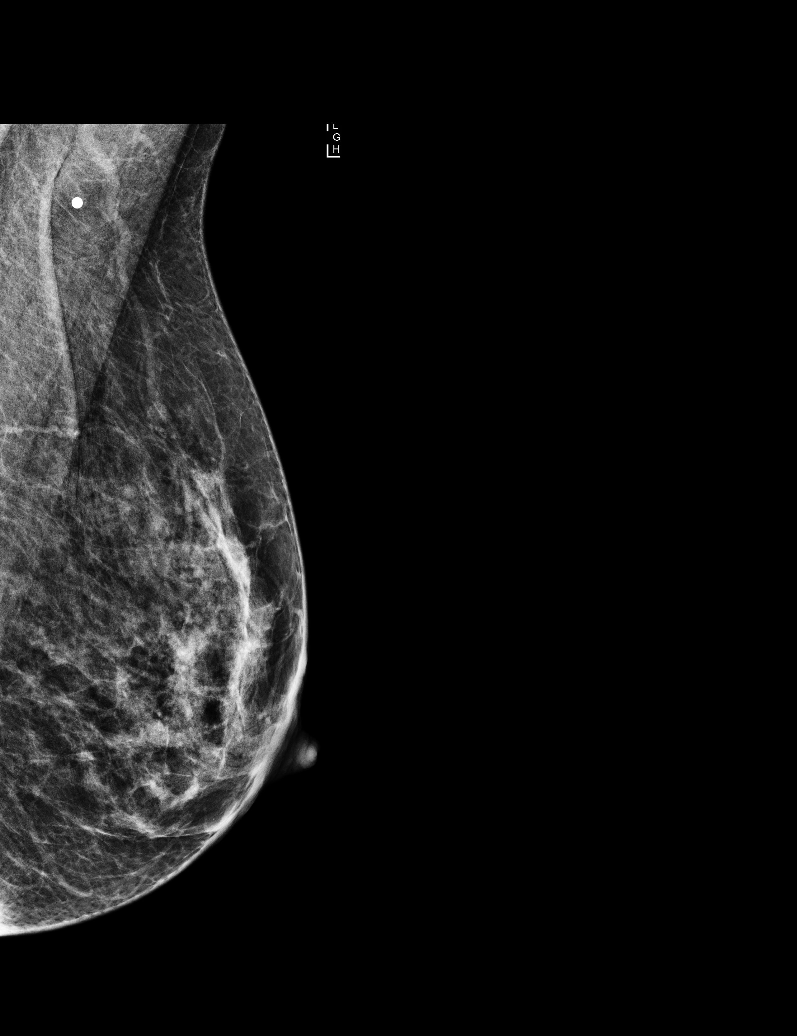

[L TAN synth-2D]
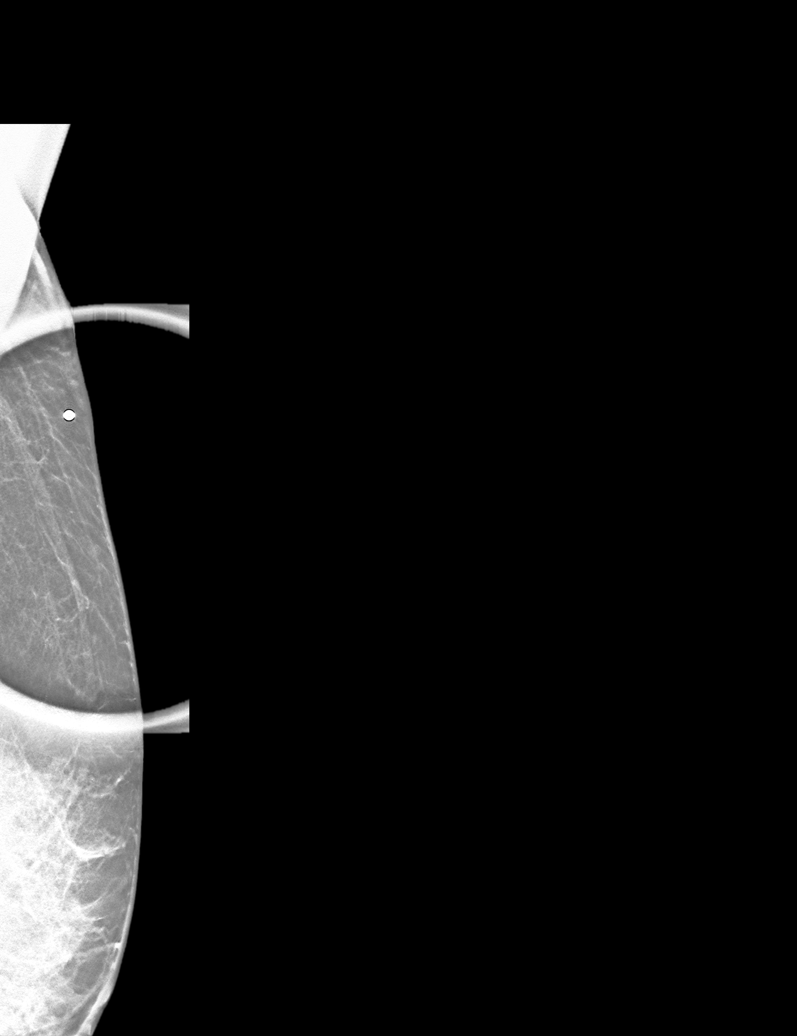

[6 of 21 positions shown; findings below may reference images not displayed]

ACR Breast Density Category c: The breast tissue is heterogeneously
dense, which may obscure small masses.
FINDINGS: No suspicious masses, calcifications, or distortion.

Mammographic images were processed with CAD.

On physical exam, 2 palpable lumps are identified.

Targeted ultrasound is performed, showing a small sebaceous cyst in
the region of the more superior palpable lump felt by the patient,
waxing and waning over time. The larger lump felt by the patient and
her physician measures 3.3 x 0.7 cm in greatest dimension. It is
hypoechoic and circumscribed with mild increased through
transmission. No abnormal internal blood flow.
IMPRESSION: The patient is feeling a small sebaceous cyst. The patient's
physician is feeling a hypoechoic probably benign mass measuring
x 0.7 cm.

RECOMMENDATION:
Six-month follow-up of the probably benign left axillary mass.

I have discussed the findings and recommendations with the patient.
Results were also provided in writing at the conclusion of the
visit. If applicable, a reminder letter will be sent to the patient
regarding the next appointment.

BI-RADS CATEGORY  3: Probably benign.

## 2018-11-02 ENCOUNTER — Encounter: Payer: Self-pay | Admitting: Gastroenterology

## 2018-11-17 ENCOUNTER — Ambulatory Visit (AMBULATORY_SURGERY_CENTER): Payer: Self-pay

## 2018-11-17 ENCOUNTER — Other Ambulatory Visit: Payer: Self-pay

## 2018-11-17 ENCOUNTER — Encounter: Payer: Self-pay | Admitting: Gastroenterology

## 2018-11-17 VITALS — Ht 67.0 in | Wt 149.6 lb

## 2018-11-17 DIAGNOSIS — Z1211 Encounter for screening for malignant neoplasm of colon: Secondary | ICD-10-CM

## 2018-11-17 MED ORDER — NA SULFATE-K SULFATE-MG SULF 17.5-3.13-1.6 GM/177ML PO SOLN: 1.0000 | Freq: Once | ORAL | 0 refills | Status: AC

## 2018-11-17 NOTE — Progress Notes (Signed)
No egg or soy allergy known to patient  No issues with past sedation with any surgeries  or procedures, no intubation problems  No diet pills per patient No home 02 use per patient  No blood thinners per patient  Pt denies issues with constipation  No A fib or A flutter  EMMI video sent to pt's e mail  

## 2018-12-01 ENCOUNTER — Encounter: Payer: BLUE CROSS/BLUE SHIELD | Admitting: Gastroenterology

## 2019-07-23 ENCOUNTER — Other Ambulatory Visit: Payer: Self-pay

## 2019-07-23 ENCOUNTER — Ambulatory Visit
Admission: RE | Admit: 2019-07-23 | Discharge: 2019-07-23 | Disposition: A | Discharge status: Home / Self Care | Payer: 59 | Source: Ambulatory Visit | Attending: Obstetrics | Admitting: Obstetrics

## 2019-07-23 ENCOUNTER — Other Ambulatory Visit: Payer: Self-pay | Admitting: Obstetrics

## 2019-07-23 DIAGNOSIS — Z1231 Encounter for screening mammogram for malignant neoplasm of breast: Secondary | ICD-10-CM

## 2019-07-27 ENCOUNTER — Ambulatory Visit: Payer: 59 | Admitting: Sports Medicine

## 2019-07-27 ENCOUNTER — Encounter: Payer: Self-pay | Admitting: Sports Medicine

## 2019-07-27 ENCOUNTER — Other Ambulatory Visit: Payer: Self-pay

## 2019-07-27 VITALS — BP 112/80 | Ht 67.0 in | Wt 145.0 lb

## 2019-07-27 DIAGNOSIS — M2550 Pain in unspecified joint: Secondary | ICD-10-CM

## 2019-07-27 MED ORDER — PREDNISONE 10 MG PO TABS: ORAL_TABLET | ORAL | 0 refills | Status: DC

## 2019-07-27 NOTE — Progress Notes (Signed)
PCP: Deland Pretty, MD  Subjective:   HPI: Patient is a 53 y.o. female here for for multiple complaints.  Patient notes she has bilateral elbow pain, bilateral wrist pain, bilateral hand swelling and numbness, bilateral hip pain that radiates down to her knees, and bilateral foot aching.  Patient notes her pain is been present for the last several years.  She denies any specific injury or trauma that preceded the pain.  Several years ago she notes she had finger swelling as well as bilateral elbow pain.  She saw a rheumatology who did a basic work-up and said she is negative for rheumatoid arthritis.  Following this she notes someone gave her diagnosis of fibromyalgia but no further work-up was done.  She said she was able to ignore her symptoms for the last 2 years but things have progressively worsened since then.  She notes her symptoms are now severe enough where she would like additional work-up to see what the cause of them is.  She notes the most concerning symptoms are her bilateral elbow pain.  Pain is diffuse.  It does not radiate.  She denies any skin changes.  She denies any GI symptoms.  She denies any cardiac or respiratory symptoms.  She denies any lightheadedness, dizziness, fevers, chills.  Patient notes she is otherwise healthy other than history of anxiety and asthma.  She does not have a history of diabetes.   Review of Systems: See HPI above.  Past Medical History:  Diagnosis Date  . Anxiety   . Arthritis   . Asthma   . Cancer ([place])    basal cell   . Depression   . Hyperlipidemia     Current Outpatient Medications on File Prior to Visit  Medication Sig Dispense Refill  . acetaminophen (TYLENOL) 500 MG tablet Take 500 mg by mouth every 6 (six) hours as needed.    . ALBUTEROL IN Inhale into the lungs.    Marland Kitchen azelastine (ASTELIN) 0.1 % nasal spray Place 1 spray into both nostrils 2 (two) times daily. Use in each nostril as directed    . Calcium-Magnesium-Vitamin D  185-50-100 MG-MG-UNIT CAPS Take by mouth.    . cetirizine (ZYRTEC) 5 MG tablet Take 5 mg by mouth daily.    . cyclobenzaprine (FLEXERIL) 5 MG tablet Take 1 tablet (5 mg total) by mouth at bedtime. As needed for pain 60 tablet 0  . estradiol (VIVELLE-DOT) 0.1 MG/24HR patch Place 1 patch onto the skin 2 (two) times a week.    . flurbiprofen (ANSAID) 100 MG tablet     . fluticasone (FLONASE) 50 MCG/ACT nasal spray Place into both nostrils daily.    . fluticasone furoate-vilanterol (BREO ELLIPTA) 200-25 MCG/INH AEPB Inhale 1 puff into the lungs daily.    Marland Kitchen ibuprofen (ADVIL,MOTRIN) 200 MG tablet Take 200 mg by mouth every 6 (six) hours as needed.    . methocarbamol (ROBAXIN) 750 MG tablet TAKE 1 TABLET BY MOUTH EVERY 8 HOURS AS NEEDED FOR MUSCLE SPASM 60 tablet 0  . montelukast (SINGULAIR) 10 MG tablet     . Multiple Vitamins-Minerals (WOMENS MULTIVITAMIN PLUS PO) Take by mouth.    . progesterone (PROMETRIUM) 100 MG capsule Take 100 mg by mouth daily. Two weeks on and two weeks off    . venlafaxine (EFFEXOR) 75 MG tablet Take 75 mg by mouth daily.    . vitamin C (ASCORBIC ACID) 500 MG tablet Take 500 mg by mouth daily.     No current facility-administered medications  on file prior to visit.     Past Surgical History:  Procedure Laterality Date  . CESAREAN SECTION    . ovarian septum      Allergies  Allergen Reactions  . Latex Rash    Social History   Socioeconomic History  . Marital status: Married    Spouse name: Not on file  . Number of children: Not on file  . Years of education: Not on file  . Highest education level: Not on file  Occupational History  . Not on file  Social Needs  . Financial resource strain: Not on file  . Food insecurity    Worry: Not on file    Inability: Not on file  . Transportation needs    Medical: Not on file    Non-medical: Not on file  Tobacco Use  . Smoking status: Never Smoker  . Smokeless tobacco: Never Used  Substance and Sexual Activity   . Alcohol use: No  . Drug use: Never  . Sexual activity: Not on file  Lifestyle  . Physical activity    Days per week: Not on file    Minutes per session: Not on file  . Stress: Not on file  Relationships  . Social Herbalist on phone: Not on file    Gets together: Not on file    Attends religious service: Not on file    Active member of club or organization: Not on file    Attends meetings of clubs or organizations: Not on file    Relationship status: Not on file  . Intimate partner violence    Fear of current or ex partner: Not on file    Emotionally abused: Not on file    Physically abused: Not on file    Forced sexual activity: Not on file  Other Topics Concern  . Not on file  Social History Narrative  . Not on file    Family History  Problem Relation Age of Onset  . Colon cancer Neg Hx   . Heart disease Neg Hx   . Esophageal cancer Neg Hx   . Rectal cancer Neg Hx   . Stomach cancer Neg Hx         Objective:  Physical Exam: There were no vitals taken for this visit. Gen: NAD, comfortable in exam room Lungs: Breathing comfortably on room air Elbow Exam Bilateral -Inspection: No discoloration, no deformity -Palpation: No tenderness to palpation -ROM: Normal ROM with flexion, extension, pronation, supination -Strength: 5/5 strength with flexion, extension, pronation, supination -Valgus stress: Negative -Tinels Sign: Negative  Hand/wrist Exam Bilateral -Inspection: No deformity, no discoloration -Palpation: distal radius: non-tender; Distal ulna: non-tender; scaphoid: non-tender -ROM: Normal range of motion with flexion, extension, radial deviation, ulnar deviation, pronation, supination -Strength: Flexion: 5/5; Extension: 5/5; Radial Deviation: 5/5; Ulnar Deviation: 5/5  Cervical Exam:  -Full range of motion with flexion, extension, lateral rotation.  -Spurlings: Negative     Assessment & Plan:  Patient is a 53 y.o. female here for  evaluation of arthralgia multijoint pain  1.  Arthralgias involving multiple joints -History concerning for some sort of rheumatologic process -Patient will be referred to Dr. Trudie Reed for further rheumatologic evaluation -Patient was given a 6-day course of prednisone taper.  She was advised to call her clinic this Friday to let us know if she is having any improvement of her symptoms.  If she has no response to the prednisone is less likely that the underlying cause of  her symptoms is rheumatologic in nature.  Patient seen and evaluated with the sports medicine fellow.  I agree with the above plan of care.  I would like for the patient to be reevaluated by rheumatology.  I wonder whether or not she has a seronegative arthropathy.  I have given her a 6-day Sterapred Dosepak and have asked her to call the office in a few days to let me know how she is responding.  Otherwise, I will defer further work-up and treatment to the discretion of Dr. Trudie Reed and the patient will follow-up with me as needed.

## 2019-07-27 NOTE — Patient Instructions (Addendum)
We are concerned that your symptoms may be caused by a rheumatologic problem. -We will for you to rheumatologist for further blood work and evaluation  Dr. Gavin Pound 09/01/19 @ [id number], [address], [place], [place] [address] Phone: [phone]  -We sent in a prescription for prednisone.  This is a 6-day tapering course. -If the primary cause of your symptoms is rheumatologic prednisone will likely improve your symptoms.  Please call us on Friday to let us know how you are feeling.

## 2020-07-05 ENCOUNTER — Other Ambulatory Visit: Payer: Self-pay | Admitting: Obstetrics

## 2020-07-05 DIAGNOSIS — Z1231 Encounter for screening mammogram for malignant neoplasm of breast: Secondary | ICD-10-CM

## 2020-07-24 ENCOUNTER — Other Ambulatory Visit: Payer: Self-pay

## 2020-07-24 ENCOUNTER — Ambulatory Visit
Admission: RE | Admit: 2020-07-24 | Discharge: 2020-07-24 | Disposition: A | Discharge status: Home / Self Care | Payer: 59 | Source: Ambulatory Visit | Attending: Obstetrics | Admitting: Obstetrics

## 2020-07-24 DIAGNOSIS — Z1231 Encounter for screening mammogram for malignant neoplasm of breast: Secondary | ICD-10-CM

## 2021-03-15 ENCOUNTER — Ambulatory Visit: Payer: Self-pay

## 2021-03-15 ENCOUNTER — Ambulatory Visit: Payer: 59 | Admitting: Orthopedic Surgery

## 2021-03-15 DIAGNOSIS — M25562 Pain in left knee: Secondary | ICD-10-CM

## 2021-03-15 DIAGNOSIS — M79672 Pain in left foot: Secondary | ICD-10-CM

## 2021-03-15 NOTE — Progress Notes (Signed)
I Office Visit Note   Patient: Emily Ferguson           Date of Birth: 1965/12/10           MRN: [id number] Visit Date: 03/15/2021 Requested by: Deland Pretty, MD [address] [place],  [place] [address] PCP: Deland Pretty, MD  Subjective: Chief Complaint  Patient presents with   Left Knee - Pain   Left Foot - Pain    HPI: Emily Ferguson is a 55 year old patient with left knee pain and left foot pain.  She reports left knee pain for 6 weeks localizing to the pes bursa region.  She was doing a ropes course of the sinus that are in had pain develop after that.  Reports some pain with lateral movement.  She does like to walk about 4 to 5 miles a day.  Has gotten more painful in the last week.  She plays tennis.  Has a little bit of swelling in the morning.  Patient also reports pain in the left foot at the base of the second metatarsal.  Hurts after walking.  That has been going on for 3 to 4 months but she denies any discrete injury.  She does change her shoes every 3 to 4 months.  Denies any swelling and she does not take any medication for the problem.  This toe pain does not keep her from walking and the knee pain also does not keep her from walking.  Symptoms are worse when she is barefoot.  Denies any mechanical symptoms in the knee.              ROS: All systems reviewed are negative as they relate to the chief complaint within the history of present illness.  Patient denies  fevers or chills.   Assessment & Plan: Visit Diagnoses:  1. Pain in left foot   2. Left knee pain, unspecified chronicity     Plan: Impression is left foot pain which does not look like a stress reaction or stress fracture based on radiographs and exam.  More likely just some synovitis in that MTP joint or possibly metatarsalgia.  MRI scanning of the foot would be the next step.  Regarding the left knee radiographs also normal in this regard as they were with the foot.  No effusion.  Pes  bursa tenderness and some focal swelling present which was examined with ultrasound and found to be nondiagnostic.  Consideration could be given towards knee injection and/or further imaging if her symptoms worsen.  Right now she has a lot of trips planned to [place] in the beach.  These aches and pains are aggravating but not game changers in terms of affecting her ability to live her daily life.  We will see her back as needed.  Follow-Up Instructions: No follow-ups on file.   Orders:  Orders Placed This Encounter  Procedures   XR Foot Complete Left   XR KNEE 3 VIEW LEFT   No orders of the defined types were placed in this encounter.     Procedures: No procedures performed   Clinical Data: No additional findings.  Objective: Vital Signs: There were no vitals taken for this visit.  Physical Exam:   Constitutional: Patient appears well-developed HEENT:  Head: Normocephalic Eyes:EOM are normal Neck: Normal range of motion Cardiovascular: Normal rate Pulmonary/chest: Effort normal Neurologic: Patient is alert Skin: Skin is warm Psychiatric: Patient has normal mood and affect   Ortho Exam: Ortho exam demonstrates  full active and passive range of motion of that left knee.  Mild focal tenderness at the pes bursa region.  Collateral crucial ligaments are stable.  McMurray compression testing negative for medial meniscal or lateral meniscal pathology.  Left foot is examined.  Mild tenderness but no swelling at the MTP joint of the second toe.  Pronation supination of the foot nontender.  Heel cord not tender or tight.  Gait is normal.  No callus formation present for lesser toe deformity noted.  Toe range of motion at the MTP joint symmetric left versus right for all toes.  Specialty Comments:  No specialty comments available.  Imaging: XR Foot Complete Left  Result Date: 03/15/2021 AP lateral oblique radiographs left foot reviewed.  No acute fracture.  Tarsometatarsal alignment  intact.  No periosteal reaction around the metatarsal heads.  No arthritis in the MTP or phalangeal joints.    PMFS History: Patient Active Problem List   Diagnosis Date Noted   Left hip pain 04/08/2014   Left knee pain 04/08/2014   Low back pain 04/08/2014   Past Medical History:  Diagnosis Date   Anxiety    Arthritis    Asthma    Cancer ([place])    basal cell    Depression    Hyperlipidemia     Family History  Problem Relation Age of Onset   Colon cancer Neg Hx    Heart disease Neg Hx    Esophageal cancer Neg Hx    Rectal cancer Neg Hx    Stomach cancer Neg Hx     Past Surgical History:  Procedure Laterality Date   CESAREAN SECTION     ovarian septum     Social History   Occupational History   Not on file  Tobacco Use   Smoking status: Never   Smokeless tobacco: Never  Substance and Sexual Activity   Alcohol use: No   Drug use: Never   Sexual activity: Not on file

## 2021-03-17 ENCOUNTER — Encounter: Payer: Self-pay | Admitting: Orthopedic Surgery

## 2021-03-19 ENCOUNTER — Ambulatory Visit: Payer: 59 | Admitting: Surgical

## 2021-07-24 ENCOUNTER — Other Ambulatory Visit: Payer: Self-pay | Admitting: Obstetrics

## 2021-07-24 DIAGNOSIS — Z1231 Encounter for screening mammogram for malignant neoplasm of breast: Secondary | ICD-10-CM

## 2021-07-25 ENCOUNTER — Ambulatory Visit
Admission: RE | Admit: 2021-07-25 | Discharge: 2021-07-25 | Disposition: A | Discharge status: Home / Self Care | Payer: 59 | Source: Ambulatory Visit | Attending: Obstetrics | Admitting: Obstetrics

## 2021-07-25 ENCOUNTER — Other Ambulatory Visit: Payer: Self-pay

## 2021-07-25 DIAGNOSIS — Z1231 Encounter for screening mammogram for malignant neoplasm of breast: Secondary | ICD-10-CM

## 2021-10-01 ENCOUNTER — Other Ambulatory Visit: Payer: Self-pay

## 2021-10-01 ENCOUNTER — Ambulatory Visit: Payer: Self-pay

## 2021-10-01 ENCOUNTER — Encounter: Payer: Self-pay | Admitting: Orthopedic Surgery

## 2021-10-01 ENCOUNTER — Ambulatory Visit: Payer: 59 | Admitting: Orthopedic Surgery

## 2021-10-01 DIAGNOSIS — M7502 Adhesive capsulitis of left shoulder: Secondary | ICD-10-CM

## 2021-10-01 DIAGNOSIS — M25512 Pain in left shoulder: Secondary | ICD-10-CM

## 2021-10-01 MED ORDER — MELOXICAM 7.5 MG PO TABS: 7.5000 mg | ORAL_TABLET | Freq: Every day | ORAL | 0 refills | Status: DC

## 2021-10-01 NOTE — Progress Notes (Signed)
Office Visit Note   Patient: Emily Ferguson           Date of Birth: 1966-01-07           MRN: [id number] Visit Date: 10/01/2021 Requested by: Deland Pretty, MD [address] [place],  [place] [address] PCP: Deland Pretty, MD  Subjective: Chief Complaint  Patient presents with   Left Shoulder - Pain   Shoulder Pain    HPI: Emily Ferguson is a 56 year old patient with left shoulder pain.  This has been going on since November 2022.  Denies any history of injury.  Reports primarily anterior pain with no radiation and no numbness and tingling.  Tried Mobic for 1 month with some relief but the pain returned after she stopped.  Patient states that her shoulder aches all the time with most any movement.  She wonders if it is related to the flu shot.  Hurts for her to toss the serve with her left hand.  Hurts for her to put her coat on as well as with overhead motion.  Also bothers her at night.  Denies any mechanical symptoms.  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  She needs to be ready for that with her left arm.              ROS: All systems reviewed are negative as they relate to the chief complaint within the history of present illness.  Patient denies  fevers or chills.   Assessment & Plan: Visit Diagnoses:  1. Left shoulder pain, unspecified chronicity   2. Adhesive capsulitis of left shoulder     Plan: Impression is early adhesive capsulitis left shoulder.  Plan is stretching with sheets provided.  Also Mobic for 3 to 4 weeks but at a reduced dose.  Could consider intra-articular injection in mid February so she is ready for softball season if her symptoms or not improving.  No concerns at this time for rotator cuff pathology based on strength examination and symmetric mild crepitus in both shoulders with internal and external rotation.  Follow-Up Instructions: Return if symptoms worsen or fail to improve.   Orders:  Orders Placed This Encounter   Procedures   XR Shoulder Left   No orders of the defined types were placed in this encounter.     Procedures: No procedures performed   Clinical Data: No additional findings.  Objective: Vital Signs: There were no vitals taken for this visit.  Physical Exam:   Constitutional: Patient appears well-developed HEENT:  Head: Normocephalic Eyes:EOM are normal Neck: Normal range of motion Cardiovascular: Normal rate Pulmonary/chest: Effort normal Neurologic: Patient is alert Skin: Skin is warm Psychiatric: Patient has normal mood and affect   Ortho Exam: Ortho exam demonstrates full active and passive range of motion of the right shoulder.  Right shoulder has motion of 80/100/170.  On the left-hand side passive range of motion is 65/100/170.  Rotator cuff strength intact bilaterally to infraspinatus supraspinatus and subscap muscle testing.  Neck range of motion full.  Negative O'Brien's testing bilaterally.  No discrete AC joint tenderness left versus right.  Specialty Comments:  No specialty comments available.  Imaging: XR Shoulder Left  Result Date: 10/01/2021 AP lateral outlet radiographs left shoulder reviewed.  No acute fracture.  Acromiohumeral distance maintained.  No AC joint or glenohumeral joint arthritis.  Visualized lung fields clear.  Normal left shoulder radiographs.    PMFS History: Patient Active Problem List   Diagnosis Date Noted  Left hip pain 04/08/2014   Left knee pain 04/08/2014   Low back pain 04/08/2014   Past Medical History:  Diagnosis Date   Anxiety    Arthritis    Asthma    Cancer ([place])    basal cell    Depression    Hyperlipidemia     Family History  Problem Relation Age of Onset   Colon cancer Neg Hx    Heart disease Neg Hx    Esophageal cancer Neg Hx    Rectal cancer Neg Hx    Stomach cancer Neg Hx    Breast cancer Neg Hx     Past Surgical History:  Procedure Laterality Date   CESAREAN SECTION     ovarian septum      Social History   Occupational History   Not on file  Tobacco Use   Smoking status: Never   Smokeless tobacco: Never  Substance and Sexual Activity   Alcohol use: No   Drug use: Never   Sexual activity: Not on file

## 2021-10-01 NOTE — Addendum Note (Signed)
Addended byLaurann Montana on: 10/01/2021 01:19 PM   Modules accepted: Orders

## 2022-07-10 ENCOUNTER — Other Ambulatory Visit: Payer: Self-pay | Admitting: Obstetrics

## 2022-07-10 DIAGNOSIS — Z1231 Encounter for screening mammogram for malignant neoplasm of breast: Secondary | ICD-10-CM

## 2022-08-06 ENCOUNTER — Encounter: Payer: Self-pay | Admitting: Internal Medicine

## 2022-08-06 ENCOUNTER — Ambulatory Visit (INDEPENDENT_AMBULATORY_CARE_PROVIDER_SITE_OTHER): Payer: Commercial Managed Care - PPO | Admitting: Internal Medicine

## 2022-08-06 ENCOUNTER — Ambulatory Visit (INDEPENDENT_AMBULATORY_CARE_PROVIDER_SITE_OTHER): Payer: Commercial Managed Care - PPO

## 2022-08-06 VITALS — BP 118/72 | HR 72 | Temp 97.6°F | Ht 67.0 in | Wt 140.2 lb

## 2022-08-06 DIAGNOSIS — R0609 Other forms of dyspnea: Secondary | ICD-10-CM | POA: Diagnosis not present

## 2022-08-06 LAB — POCT EXHALED NITRIC OXIDE: FeNO level (ppb): 17

## 2022-08-06 LAB — D-DIMER, QUANTITATIVE: D-Dimer, Quant: <0.19 mcg/mL FEU (ref <0.50)

## 2022-08-06 NOTE — Assessment & Plan Note (Signed)
Onset 2016 on background of chronic rhinitis and recurrent sinusitis  - 08/06/2022   Walked on RA  x  3  lap(s) =  approx 750  ft  @ fast pace, stopped due to end of study with lowest 02 sats 100% fast pace - FENO 08/06/2022    = 17  - Allergy screen 08/06/2022 >  Eos 0.1/  IgE  22   Symptoms are markedly disproportionate to objective findings and not clear to what extent this is actually a pulmonary  problem but pt does appear to have difficult to sort out respiratory symptoms of unknown origin for which  DDX  = almost all start with A and  include Adherence, Ace Inhibitors, Acid Reflux, Active Sinus Disease, Alpha 1 Antitripsin deficiency, Anxiety masquerading as Airways dz,  ABPA,  Allergy(esp in young), Aspiration (esp in elderly), Adverse effects of meds,  Active smoking or Vaping, A bunch of PE's/clot burden (a few small clots can't cause this syndrome unless there is already severe underlying pulm or vascular dz with poor reserve),  Anemia or thyroid disorder, plus two Bs  = Bronchiectasis and Beta blocker use..and one C= CHF    Adherence is always the initial "prime suspect" and is a multilayered concern that requires a "trust but verify" approach in every patient - starting with knowing how to use medications, especially inhalers, correctly, keeping up with refills and understanding the fundamental difference between maintenance and prns vs those medications only taken for a very short course and then stopped and not refilled.  -  - The proper method of use, as well as anticipated side effects, of a metered-dose inhaler were discussed and demonstrated to the patient using teach back method.    ? Allegy /asthma > certainly at risk but nothing to suggest by today's eval so rx saba prn for now Re SABA :  I spent extra time with pt today reviewing appropriate use of albuterol for prn use on exertion with the following points: 1) saba is for relief of sob that does not improve by walking a slower  pace or resting but rather if the pt does not improve after trying this first. 2) If the pt is convinced, as many are, that saba helps recover from activity faster then it's easy to tell if this is the case by re-challenging : ie stop, take the inhaler, then p 5 minutes try the exact same activity (intensity of workload) that just caused the symptoms and see if they are substantially diminished or not after saba 3) if there is an activity that reproducibly causes the symptoms, try the saba 15 min before the activity on alternate days   If in fact the saba really does help, then fine to continue to use it prn but advised may need to look closer at the maintenance regimen being used to achieve better control of airways disease with exertion.   ? Active sinus dz > low threshold to either repeat ent eval or sinus ct if not improving   ? ? Anxiety/ deconditioning s/p covid > usually at the bottom of this list of usual suspects but  may interfere with adherence and also interpretation of response or lack thereof to symptom management which can be quite subjective.  >>>rec regular submax ex  ? A bunch of PEs  D dimer nl - while a normal  or high normal value (seen commonly in the elderly or chronically ill)  may miss small peripheral pe, the clot burden with  sob is moderately high and the d dimer  has a very high neg pred value if used in this setting.    ? Chf > bnp nl   Don't really have an explanation for symptoms at this point so regroup in 4 weeks with all meds in hand using a trust but verify approach to confirm accurate Medication  Reconciliation The principal here is that until we are certain that the  patients are doing what we've asked, it makes no sense to ask them to do more.    Each maintenance medication was reviewed in detail including emphasizing most importantly the difference between maintenance and prns and under what circumstances the prns are to be triggered using an action plan  format where appropriate.  Total time for H and P, chart review, counseling, reviewing hfa device(s) , directly observing portions of ambulatory 02 saturation study/ and generating customized AVS unique to this office visit / same day charting  > 45 min with pt new to me with refractory symptoms of unknown origin.

## 2022-08-06 NOTE — Progress Notes (Signed)
Emily Ferguson, female    DOB: 03/30/1952   MRN: [id number]   Brief patient profile:  56  yowf never smoker grew up until age 56 with smoker  pna at 56 years old  and sinus infections more than twice yearly (just like brother)  with poor ex tol as teenager and  referred to pulmonary clinic 08/06/2022 by NP for Dr Emily Ferguson  with new dx of asthma by Emily Ferguson 2016 no better on inhaler or shots (though could not reach target) and sinus problems improved p stopped the shots  then Covd July 2022 and worse ex tol/ chest tightness at rest but not typically with ex     History of Present Illness  08/06/2022  Pulmonary/ 1st office eval/Emily Ferguson off arnuity  Dx Asthma.  Recurrent thrush.  Thinks this may be due to inhaler.  Sx x 2 years.  Exertion causes SOB.  Dyspnea:  walking 4 miles plus per day s needing any inhalers  still can't do steps s giving out / can't run  Cough: varies but not a problem now  Sleep: sleeping fine with head of  bed slt elevated  SABA use: none lately  Still a lot of nasal congestion but no infections in last few years on astelin seems to help Has seen ent in [place] [facility] and one other can't name  who/where  No obvious day to day or daytime pattern/variability or assoc excess/ purulent sputum or mucus plugs or hemoptysis or cp   subjective wheeze or overt sinus or hb symptoms.   sleeping without nocturnal  or early am exacerbation  of respiratory  c/o's or need for noct saba. Also denies any obvious fluctuation of symptoms with weather or environmental changes or other aggravating or alleviating factors except as outlined above   No unusual exposure hx or h/o childhood pna/ asthma or knowledge of premature birth.  Current Allergies, Complete Past Medical History, Past Surgical History, Family History, and Social History were reviewed in [employer] record.  ROS  The following are not active complaints unless bolded Hoarseness, sore throat, dysphagia, dental  problems, itching, sneezing,  nasal congestion or discharge of excess mucus or purulent secretions, ear ache,   fever, chills, sweats, unintended wt loss or wt gain, classically pleuritic or exertional cp,  orthopnea pnd or arm/hand swelling  or leg swelling, presyncope, palpitations, abdominal pain, anorexia, nausea, vomiting, diarrhea  or change in bowel habits or change in bladder habits, change in stools or change in urine, dysuria, hematuria,  rash, arthralgias, visual complaints, headache, numbness, weakness or ataxia or problems with walking or coordination,  change in mood or  memory.             Past Medical History:  Diagnosis Date   DM (diabetes mellitus) (HCC)    High cholesterol    HTN (hypertension)          Past Medical History:  Diagnosis Date   Anxiety    Arthritis    Asthma    Cancer (HCC)    basal cell    Depression    Hyperlipidemia     Outpatient Medications Prior to Visit  Medication Sig Dispense Refill   ALBUTEROL IN Inhale into the lungs.     azelastine (ASTELIN) 0.1 % nasal spray Place 1 spray into both nostrils 2 (two) times daily. Use in each nostril as directed     flurbiprofen (ANSAID) 100 MG tablet      levocetirizine (XYZAL) 5 MG  tablet Take 5 mg by mouth every evening.     montelukast (SINGULAIR) 10 MG tablet      pravastatin (PRAVACHOL) 20 MG tablet Take 20 mg by mouth daily.     Brompheniramine-Phenylephrine (DIMETAPP COLD/ALLERGY PO) Take 10 mg by mouth. PRN for cold (Patient not taking: Reported on 08/06/2022)     Calcium-Magnesium-Vitamin D 185-50-100 MG-MG-UNIT CAPS Take by mouth. (Patient not taking: Reported on 08/06/2022)     cetirizine (ZYRTEC) 5 MG tablet Take 5 mg by mouth daily. (Patient not taking: Reported on 08/06/2022)     estradiol (VIVELLE-DOT) 0.1 MG/24HR patch Place 1 patch onto the skin 2 (two) times a week. (Patient not taking: Reported on 08/06/2022)     fluticasone (FLONASE) 50 MCG/ACT nasal spray Place into both nostrils  daily. (Patient not taking: Reported on 08/06/2022)     Fluticasone Furoate (ARNUITY ELLIPTA) 50 MCG/ACT AEPB Inhale into the lungs. (Patient not taking: Reported on 08/06/2022)     meloxicam (MOBIC) 7.5 MG tablet Take 1 tablet (7.5 mg total) by mouth daily. (Patient not taking: Reported on 08/06/2022) 30 tablet 0   Multiple Vitamins-Minerals (WOMENS MULTIVITAMIN PLUS PO) Take by mouth. (Patient not taking: Reported on 08/06/2022)     predniSONE (DELTASONE) 10 MG tablet Take as directed per MD instructions (Patient not taking: Reported on 08/06/2022) 21 tablet 0   progesterone (PROMETRIUM) 100 MG capsule Take 100 mg by mouth daily. Two weeks on and two weeks off (Patient not taking: Reported on 08/06/2022)     vitamin C (ASCORBIC ACID) 500 MG tablet Take 500 mg by mouth daily. (Patient not taking: Reported on 08/06/2022)     No facility-administered medications prior to visit.     Objective:     BP 118/72 (BP Location: Left Arm, Patient Position: Sitting, Cuff Size: Normal)   Pulse 72   Temp 97.6 F (36.4 C) (Oral)   Ht 5\' 7"  (1.702 m)   Wt 140 lb 3.2 oz (63.6 kg)   SpO2 99%   BMI 21.96 kg/m   SpO2: 99 %  Pleasant healthy appearing amb wf nad   HEENT : Oropharynx  clear/ tongue min thrush    Nasal turbinates mild /mod edema non-specific    NECK :  without  apparent JVD/ palpable Nodes/TM    LUNGS: no acc muscle use,  Nl contour chest which is clear to A and P bilaterally without cough on insp or exp maneuvers   CV:  RRR  no s3 or murmur or increase in P2, and no edema   ABD:  soft and nontender with nl inspiratory excursion in the supine position. No bruits or organomegaly appreciated   MS:  Nl gait/ ext warm without deformities Or obvious joint restrictions  calf tenderness, cyanosis or clubbing    SKIN: warm and dry without lesions    NEURO:  alert, approp, nl sensorium with  no motor or cerebellar deficits apparent.    Labs ordered/ reviewed:      Chemistry       Component Value Date/Time   NA 140 08/06/2022 1542   K 4.3 08/06/2022 1542   CL 102 08/06/2022 1542   CO2 32 08/06/2022 1542   BUN 23 08/06/2022 1542   CREATININE 0.68 08/06/2022 1542      Component Value Date/Time   CALCIUM 9.9 08/06/2022 1542        Lab Results  Component Value Date   WBC 8.9 08/06/2022   HGB 14.8 08/06/2022   HCT 43.6 08/06/2022  MCV 91.8 08/06/2022   PLT 211.0 08/06/2022     Lab Results  Component Value Date   DDIMER <0.19 08/06/2022      Lab Results  Component Value Date   TSH 1.24 08/06/2022     Lab Results  Component Value Date   PROBNP 30.0 08/06/2022      No results found for: "ESRSEDRATE"               Assessment   No problem-specific Assessment & Plan notes found for this encounter.     Sandrea Hughs, MD 08/06/2022

## 2022-08-06 NOTE — Patient Instructions (Addendum)
Only use your albuterol as a rescue medication to be used if you can't catch your breath by resting or doing a relaxed purse lip breathing pattern.  - The less you use it, the better it will work when you need it. - Ok to use up to 2 puffs  every 4 hours if you must but call for immediate appointment if use goes up over your usual need - Don't leave home without it !!  (think of it like the spare tire for your car)   Also  Ok to try albuterol 15 min before an activity (on alternating days)  that you know would usually make you short of breath and see if it makes any difference and if makes none then don't take albuterol after activity unless you can't catch your breath as this means it's the resting that helps, not the albuterol.           Please schedule a follow up office visit in 4 weeks, sooner if needed  with all medications /inhalers/ solutions in hand so we can verify exactly what you are taking. This includes all medications from all doctors and over the counters

## 2022-08-07 ENCOUNTER — Encounter: Payer: Self-pay | Admitting: Internal Medicine

## 2022-08-07 LAB — TSH: TSH: 1.24 u[IU]/mL (ref 0.35–5.50)

## 2022-08-07 LAB — CBC WITH DIFFERENTIAL/PLATELET
Basophils Absolute: 0 10*3/uL (ref 0.0–0.1)
Basophils Relative: 0.4 % (ref 0.0–3.0)
Eosinophils Absolute: 0.1 10*3/uL (ref 0.0–0.7)
Eosinophils Relative: 0.7 % (ref 0.0–5.0)
HCT: 43.6 % (ref 36.0–46.0)
Hemoglobin: 14.8 g/dL (ref 12.0–15.0)
Lymphocytes Relative: 20.1 % (ref 12.0–46.0)
Lymphs Abs: 1.8 10*3/uL (ref 0.7–4.0)
MCHC: 34 g/dL (ref 30.0–36.0)
MCV: 91.8 fl (ref 78.0–100.0)
Monocytes Absolute: 0.6 10*3/uL (ref 0.1–1.0)
Monocytes Relative: 7 % (ref 3.0–12.0)
Neutro Abs: 6.4 10*3/uL (ref 1.4–7.7)
Neutrophils Relative %: 71.8 % (ref 43.0–77.0)
Platelets: 211 10*3/uL (ref 150.0–400.0)
RBC: 4.75 Mil/uL (ref 3.87–5.11)
RDW: 13.5 % (ref 11.5–15.5)
WBC: 8.9 10*3/uL (ref 4.0–10.5)

## 2022-08-07 LAB — BASIC METABOLIC PANEL
BUN: 23 mg/dL (ref 6–23)
CO2: 32 mEq/L (ref 19–32)
Calcium: 9.9 mg/dL (ref 8.4–10.5)
Chloride: 102 mEq/L (ref 96–112)
Creatinine, Ser: 0.68 mg/dL (ref 0.40–1.20)
GFR: 99.07 mL/min (ref >60.00)
Glucose, Bld: 94 mg/dL (ref 70–99)
Potassium: 4.3 mEq/L (ref 3.5–5.1)
Sodium: 140 mEq/L (ref 135–145)

## 2022-08-07 LAB — BRAIN NATRIURETIC PEPTIDE: Pro B Natriuretic peptide (BNP): 30 pg/mL (ref 0.0–100.0)

## 2022-08-07 LAB — IGE: IgE (Immunoglobulin E), Serum: 22 kU/L (ref <=114)

## 2022-09-16 ENCOUNTER — Ambulatory Visit
Admission: RE | Admit: 2022-09-16 | Discharge: 2022-09-16 | Disposition: A | Discharge status: Home / Self Care | Payer: Commercial Managed Care - PPO | Source: Ambulatory Visit | Attending: Obstetrics | Admitting: Obstetrics

## 2022-09-16 ENCOUNTER — Ambulatory Visit: Payer: Commercial Managed Care - PPO | Admitting: Internal Medicine

## 2022-09-16 DIAGNOSIS — Z1231 Encounter for screening mammogram for malignant neoplasm of breast: Secondary | ICD-10-CM

## 2022-09-16 NOTE — Progress Notes (Deleted)
Emily Ferguson, female    DOB: 03/30/1952   MRN: [id number]   Brief patient profile:  21  yowf never smoker grew up until age 57 with smoker  pna at 57 years old  and sinus infections more than twice yearly (just like brother)  with poor ex tol as teenager and  referred to pulmonary clinic 08/06/2022 by NP for Dr Shelia Media  with new dx of asthma by Orvil Feil 2016 no better on inhaler or shots (though could not reach target) and sinus problems improved p stopped the shots  then Covd July 2022 and worse ex tol/ chest tightness at rest but not typically with ex     History of Present Illness  08/06/2022  Pulmonary/ 1st office eval/Emily Ferguson off arnuity  Dx Asthma.  Recurrent thrush.  Thinks this may be due to inhaler.  Sx x 2 years.  Exertion causes SOB.  Dyspnea:  walking 4 miles plus per day s needing any inhalers  still can't do steps s giving out / can't run  Cough: varies but not a problem now  Sleep: sleeping fine with head of  bed slt elevated  SABA use: none lately  Still a lot of nasal congestion but no infections in last few years on astelin seems to help Has seen ent in [place] and one other can't name  who/where Rec Only use your albuterol as a rescue medication   Also  Ok to try albuterol 15 min before an activity (on alternating days)  that you know would usually make you short of breath          Please schedule a follow up office visit in 4 weeks, sooner if needed  with all medications /inhalers/ solutions in hand   09/16/2022  f/u ov/Emily Ferguson re: ***   maint on ***  No chief complaint on file.   Dyspnea:  *** Cough: *** Sleeping: *** SABA use: *** 02: *** Covid status:   *** Lung cancer screening :  ***    No obvious day to day or daytime variability or assoc excess/ purulent sputum or mucus plugs or hemoptysis or cp or chest tightness, subjective wheeze or overt sinus or hb symptoms.   *** without nocturnal  or early am exacerbation  of respiratory  c/o's or need for noct saba.  Also denies any obvious fluctuation of symptoms with weather or environmental changes or other aggravating or alleviating factors except as outlined above   No unusual exposure hx or h/o childhood pna/ asthma or knowledge of premature birth.  Current Allergies, Complete Past Medical History, Past Surgical History, Family History, and Social History were reviewed in [employer] record.  ROS  The following are not active complaints unless bolded Hoarseness, sore throat, dysphagia, dental problems, itching, sneezing,  nasal congestion or discharge of excess mucus or purulent secretions, ear ache,   fever, chills, sweats, unintended wt loss or wt gain, classically pleuritic or exertional cp,  orthopnea pnd or arm/hand swelling  or leg swelling, presyncope, palpitations, abdominal pain, anorexia, nausea, vomiting, diarrhea  or change in bowel habits or change in bladder habits, change in stools or change in urine, dysuria, hematuria,  rash, arthralgias, visual complaints, headache, numbness, weakness or ataxia or problems with walking or coordination,  change in mood or  memory.        No outpatient medications have been marked as taking for the 09/16/22 encounter (Appointment) with Tanda Rockers, MD.  Past Medical History:  Diagnosis Date   Anxiety    Arthritis    Asthma    Cancer ([place])    basal cell    Depression    Hyperlipidemia         Objective:     Wt Readings from Last 3 Encounters:  08/06/22 140 lb 3.2 oz (63.6 kg)  07/27/19 145 lb (65.8 kg)  11/17/18 149 lb 9.6 oz (67.9 kg)      Vital signs reviewed  09/16/2022  - Note at rest 02 sats  ***% on ***   General appearance:    ***                      Assessment

## 2022-09-24 ENCOUNTER — Encounter: Payer: Self-pay | Admitting: Gastroenterology

## 2022-10-12 NOTE — Progress Notes (Unsigned)
Emily Ferguson, female    DOB: 03/30/1952   MRN: [id number]   Brief patient profile:  57 yowf never smoker grew up until age 57 with smoker  pna at 57 years old  and sinus infections more than twice yearly (just like brother)  with poor ex tol as teenager and  referred to pulmonary clinic 08/06/2022 by NP for Dr Shelia Media  with new dx of asthma by [place] 2016 no better on inhaler or shots (though could not reach target) and sinus problems improved p stopped the shots  then Covd July 2022 and worse ex tol/ chest tightness at rest but not typically with ex     History of Present Illness  08/06/2022  Pulmonary/ 1st office eval/Joesph Marcy off arnuity x 3 weeks  Dx Asthma.  Recurrent thrush.  Thinks this may be due to inhaler.  Sx x 57 years.  Exertion causes SOB.  Dyspnea:  walking 4 miles plus per day s needing any inhalers  still can't do steps s giving out / can't run  Cough: varies but not a problem now  Sleep: sleeping fine with head of  bed slt elevated  SABA use: none lately  Still a lot of nasal congestion but no infections in last few years on astelin seems to help Has seen ent in [place] and one other can't name  who/where Rec Only use your albuterol as a rescue medication   Also  Ok to try albuterol 15 min before an activity (on alternating days)  that you know would usually make you short of breath          Please schedule a follow up office visit in 4 weeks, sooner if needed  with all medications /inhalers/ solutions in hand   10/14/2022  f/u ov/Cloa Bushong re: mild intermittent asthma  maint on albuterol as needed  Chief Complaint  Patient presents with   Follow-up    Nasal congestion.  Sinus infection January 2024.  Dyspnea:  did [place] 3[place]5 miles x 2 hours no limiting sob ? Needs saba  before tennis?   Cough: none / despite nasal congestion on R x sev months - alternates sides / Benjamine Mola has f/u Sleeping: hob slt elevated electric bed  SABA use: hardly ever Covid status:   infected  July 2022/ vax  max      No obvious day to day or daytime variability or assoc excess/ purulent sputum or mucus plugs or hemoptysis or cp or chest tightness, subjective wheeze or overt  symptoms.   Sleeping  without nocturnal  or early am exacerbation  of respiratory  c/o's or need for noct saba. Also denies any obvious fluctuation of symptoms with weather or environmental changes or other aggravating or alleviating factors except as outlined above   No unusual exposure hx or h/o childhood pna/ asthma or knowledge of premature birth.  Current Allergies, Complete Past Medical History, Past Surgical History, Family History, and Social History were reviewed in [employer] record.  ROS  The following are not active complaints unless bolded Hoarseness, sore throat, dysphagia, dental problems, itching, sneezing,  nasal congestion or discharge of excess mucus or purulent secretions, ear ache,   fever, chills, sweats, unintended wt loss or wt gain, classically pleuritic or exertional cp,  orthopnea pnd or arm/hand swelling  or leg swelling, presyncope, palpitations, abdominal pain, anorexia, nausea, vomiting, diarrhea  or change in bowel habits or change in bladder habits, change in stools or change  in urine, dysuria, hematuria,  rash, arthralgias, visual complaints, headache, numbness, weakness or ataxia or problems with walking or coordination,  change in mood or  memory.        Current Meds  Medication Sig   ALBUTEROL IN Inhale into the lungs.   Calcium Carb-Cholecalciferol (CALCIUM + D3 PO) Take 1 tablet by mouth daily.   flurbiprofen (ANSAID) 100 MG tablet    levocetirizine (XYZAL) 5 MG tablet Take 5 mg by mouth every evening.   pravastatin (PRAVACHOL) 20 MG tablet Take 20 mg by mouth daily.             Past Medical History:  Diagnosis Date   Anxiety    Arthritis    Asthma    Cancer ([place])    basal cell    Depression    Hyperlipidemia         Objective:      Wt Readings from Last 3 Encounters:  10/14/22 143 lb (64.9 kg)  08/06/22 140 lb 3.2 oz (63.6 kg)  07/27/19 145 lb (65.8 kg)      Vital signs reviewed  10/14/2022  - Note at rest 02 sats  98% on RA   General appearance:    healthy appearing amb wf nad   HEENT : wearing mask over face   NECK :  without  apparent JVD/ palpable Nodes/TM    LUNGS: no acc muscle use,  Nl contour chest which is clear to A and P bilaterally without cough on insp or exp maneuvers   CV:  RRR  no s3 or murmur or increase in P2, and no edema   ABD:  soft and nontender with nl inspiratory excursion in the supine position. No bruits or organomegaly appreciated   MS:  Nl gait/ ext warm without deformities Or obvious joint restrictions  calf tenderness, cyanosis or clubbing    SKIN: warm and dry without lesions    NEURO:  alert, approp, nl sensorium with  no motor or cerebellar deficits apparent.           Assessment

## 2022-10-14 ENCOUNTER — Encounter: Payer: Self-pay | Admitting: Internal Medicine

## 2022-10-14 ENCOUNTER — Ambulatory Visit: Payer: Commercial Managed Care - PPO | Admitting: Internal Medicine

## 2022-10-14 VITALS — BP 122/74 | HR 64 | Temp 98.1°F | Ht 67.0 in | Wt 143.0 lb

## 2022-10-14 DIAGNOSIS — J310 Chronic rhinitis: Secondary | ICD-10-CM

## 2022-10-14 DIAGNOSIS — R0609 Other forms of dyspnea: Secondary | ICD-10-CM

## 2022-10-14 MED ORDER — AZELASTINE-FLUTICASONE 137-50 MCG/ACT NA SUSP: 1.0000 | Freq: Two times a day (BID) | NASAL | 11 refills | Status: DC

## 2022-10-14 NOTE — Patient Instructions (Signed)
I emphasized that nasal steroids(dymista)  have no immediate benefit in terms of improving symptoms.  To help them reached the target tissue, the patient should use Afrin two puffs every 12 hours applied one min before using the nasal steroids.  Afrin should be stopped after no more than 5 days.  If the symptoms worsen, Afrin can be restarted after 5 days off of therapy to prevent rebound congestion from overuse of Afrin.  I also emphasized that in no way are nasal steroids a concern in terms of "addiction".   Also  Ok to try albuterol 15 min before an activity (on alternating days)  that you know would usually make you short of breath and see if it makes any difference and if makes none then don't take albuterol after activity unless you can't catch your breath as this means it's the resting that helps, not the albuterol.   If not better with nasal congestion please see Dr Benjamine Mola - here as needed

## 2022-10-14 NOTE — Assessment & Plan Note (Signed)
10/14/2022 rec dymista one bid and f/u with [facility] prn with afrin x 5 days only   I emphasized that nasal steroids have no immediate benefit in terms of improving symptoms.  To help them reached the target tissue, the patient should use Afrin two puffs every 12 hours applied one min before using the nasal steroids.  Afrin should be stopped after no more than 5 days.  If the symptoms worsen, Afrin can be restarted after 5 days off of therapy to prevent rebound congestion from overuse of Afrin.  I also emphasized that in no way are nasal steroids a concern in terms of "addiction".    Pulmonary f/u is prn           Each maintenance medication was reviewed in detail including emphasizing most importantly the difference between maintenance and prns and under what circumstances the prns are to be triggered using an action plan format where appropriate.  Total time for H and P, chart review, counseling, reviewing hfa/ and nasal  device(s) and generating customized AVS unique to this office visit / same day charting = 28 min summary f/u ov

## 2022-10-14 NOTE — Assessment & Plan Note (Signed)
Onset 2016 on background of chronic rhinitis and recurrent sinusitis    - 08/06/2022   Walked on RA  x  3  lap(s) =  approx 750  ft  @ fast pace, stopped due to end of study with lowest 02 sats 100% fast pace - FENO 08/06/2022    = 19 off arnuity x 3 weeks - Allergy screen 08/06/2022 >  Eos 0.1/  IgE  22     No evidence of asthma though does have perceived need for it prior to tennis  Advised: Re SABA :  I spent extra time with pt today reviewing appropriate use of albuterol for prn use on exertion with the following points: 1) saba is for relief of sob that does not improve by walking a slower pace or resting but rather if the pt does not improve after trying this first. 2) If the pt is convinced, as many are, that saba helps recover from activity faster then it's easy to tell if this is the case by re-challenging : ie stop, take the inhaler, then p 5 minutes try the exact same activity (intensity of workload) that just caused the symptoms and see if they are substantially diminished or not after saba 3) if there is an activity that reproducibly causes the symptoms, try the saba 15 min before the activity on alternate days   If in fact the saba really does help, then fine to continue to use it prn but advised may need to look closer at the maintenance regimen (which is nothing at all for now) being used to achieve better control of airways disease with exertion.   Since trying to minimize steroid exp would consider either Air Supra 1-2 q4 h prn or symbicort 80 1-2 bid prn if finds needs albuterol for max aerobic performance.

## 2022-10-29 ENCOUNTER — Ambulatory Visit (AMBULATORY_SURGERY_CENTER): Payer: Commercial Managed Care - PPO | Admitting: *Deleted

## 2022-10-29 VITALS — Ht 67.0 in | Wt 138.0 lb

## 2022-10-29 DIAGNOSIS — Z1211 Encounter for screening for malignant neoplasm of colon: Secondary | ICD-10-CM

## 2022-10-29 MED ORDER — NA SULFATE-K SULFATE-MG SULF 17.5-3.13-1.6 GM/177ML PO SOLN: 1.0000 | Freq: Once | ORAL | 0 refills | Status: AC

## 2022-10-29 NOTE — Progress Notes (Signed)
No egg or soy allergy known to patient  No issues known to pt with past sedation with any surgeries or procedures Patient denies ever being told they had issues or difficulty with intubation  No FH of Malignant Hyperthermia Pt is not on diet pills Pt is not on  home 02  Pt is not on blood thinners  Pt denies issues with constipation  No A fib or A flutter Have any cardiac testing pending--no Pt instructed to use [email or web address] or GoodRx for a price reduction on prep  Patient's chart reviewed by Emily Ferguson CNRA prior to previsit and patient appropriate for the [facility].  Previsit completed and red dot placed by patient's name on their procedure day (on provider's schedule).    

## 2022-10-30 ENCOUNTER — Encounter: Payer: Self-pay | Admitting: Gastroenterology

## 2022-11-04 ENCOUNTER — Telehealth: Payer: Self-pay | Admitting: Gastroenterology

## 2022-11-04 NOTE — Telephone Encounter (Signed)
Inbound call from pt, she need prep instruction sent to mychart she can find them and she is schedule for colon on 3/5.Please advise

## 2022-11-04 NOTE — Telephone Encounter (Signed)
Patient has called in asking for her prep instructions to be sent to her MyChart- instructions being sent per patient's request;

## 2022-11-12 ENCOUNTER — Encounter: Payer: Self-pay | Admitting: Gastroenterology

## 2022-11-12 ENCOUNTER — Ambulatory Visit (AMBULATORY_SURGERY_CENTER): Payer: Commercial Managed Care - PPO | Admitting: Gastroenterology

## 2022-11-12 VITALS — BP 123/78 | HR 60 | Temp 96.2°F | Resp 18 | Ht 67.0 in | Wt 138.0 lb

## 2022-11-12 DIAGNOSIS — K5730 Diverticulosis of large intestine without perforation or abscess without bleeding: Secondary | ICD-10-CM

## 2022-11-12 DIAGNOSIS — K640 First degree hemorrhoids: Secondary | ICD-10-CM

## 2022-11-12 DIAGNOSIS — Z1211 Encounter for screening for malignant neoplasm of colon: Secondary | ICD-10-CM

## 2022-11-12 MED ORDER — SODIUM CHLORIDE 0.9 % IV SOLN: 500.0000 mL | Freq: Once | INTRAVENOUS | Status: DC

## 2022-11-12 NOTE — Progress Notes (Signed)
Vitals-DT  Pt's states no medical or surgical changes since previsit or office visit.  

## 2022-11-12 NOTE — Op Note (Signed)
[place] Patient Name: Emily Ferguson Procedure Date: 11/12/2022 11:48 AM MRN: [id number] Endoscopist: Gerrit Heck , MD, [id number] Age: 57 Referring MD:  Date of Birth: December 02, 1965 Gender: Female Account #: [id number] Procedure:                Colonoscopy Indications:              Screening for colorectal malignant neoplasm, This                            is the patient's first colonoscopy Medicines:                Monitored Anesthesia Care Procedure:                Pre-Anesthesia Assessment:                           - Prior to the procedure, a History and Physical                            was performed, and patient medications and                            allergies were reviewed. The patient's tolerance of                            previous anesthesia was also reviewed. The risks                            and benefits of the procedure and the sedation                            options and risks were discussed with the patient.                            All questions were answered, and informed consent                            was obtained. Prior Anticoagulants: The patient has                            taken no anticoagulant or antiplatelet agents. ASA                            Grade Assessment: II - A patient with mild systemic                            disease. After reviewing the risks and benefits,                            the patient was deemed in satisfactory condition to                            undergo the procedure.  After obtaining informed consent, the colonoscope                            was passed under direct vision. Throughout the                            procedure, the patient's blood pressure, pulse, and                            oxygen saturations were monitored continuously. The                            PCF-HQ190L Colonoscope [id number] was introduced                            through the anus and  advanced to the the terminal                            ileum. The colonoscopy was performed without                            difficulty. The patient tolerated the procedure                            well. The quality of the bowel preparation was                            excellent. The terminal ileum, ileocecal valve,                            appendiceal orifice, and rectum were photographed. Scope In: 11:52:55 AM Scope Out: 12:07:15 PM Scope Withdrawal Time: 0 hours 9 minutes 20 seconds  Total Procedure Duration: 0 hours 14 minutes 20 seconds  Findings:                 The perianal and digital rectal examinations were                            normal.                           A few small-mouthed diverticula were found in the                            sigmoid colon.                           Non-bleeding internal hemorrhoids were found during                            retroflexion. The hemorrhoids were small.                           The exam was otherwise normal throughout the  remainder of the colon.                           The terminal ileum appeared normal. Complications:            No immediate complications. Estimated Blood Loss:     Estimated blood loss: none. Impression:               - Diverticulosis in the sigmoid colon.                           - Non-bleeding internal hemorrhoids.                           - The examined portion of the ileum was normal.                           - No specimens collected. Recommendation:           - Patient has a contact number available for                            emergencies. The signs and symptoms of potential                            delayed complications were discussed with the                            patient. Return to normal activities tomorrow.                            Written discharge instructions were provided to the                            patient.                           -  Resume previous diet.                           - Continue present medications.                           - Repeat colonoscopy in 10 years for screening                            purposes.                           - Return to GI office PRN. Gerrit Heck, MD 11/12/2022 12:11:21 PM

## 2022-11-12 NOTE — Progress Notes (Signed)
Uneventful anesthetic. Report to pacu rn. Vss. Care resumed by rn.

## 2022-11-12 NOTE — Patient Instructions (Signed)
YOU HAD AN ENDOSCOPIC PROCEDURE TODAY AT [facility]:   Refer to the procedure report that was given to you for any specific questions about what was found during the examination.  If the procedure report does not answer your questions, please call your gastroenterologist to clarify.  If you requested that your care partner not be given the details of your procedure findings, then the procedure report has been included in a sealed envelope for you to review at your convenience later.  YOU SHOULD EXPECT: Some feelings of bloating in the abdomen. Passage of more gas than usual.  Walking can help get rid of the air that was put into your GI tract during the procedure and reduce the bloating. If you had a lower endoscopy (such as a colonoscopy or flexible sigmoidoscopy) you may notice spotting of blood in your stool or on the toilet paper. If you underwent a bowel prep for your procedure, you may not have a normal bowel movement for a few days.  Please Note:  You might notice some irritation and congestion in your nose or some drainage.  This is from the oxygen used during your procedure.  There is no need for concern and it should clear up in a day or so.  SYMPTOMS TO REPORT IMMEDIATELY:  Following lower endoscopy (colonoscopy or flexible sigmoidoscopy):  Excessive amounts of blood in the stool  Significant tenderness or worsening of abdominal pains  Swelling of the abdomen that is new, acute  Fever of 100F or higher   For urgent or emergent issues, a gastroenterologist can be reached at any hour by calling [phone]. Do not use MyChart messaging for urgent concerns.    DIET:  We do recommend a small meal at first, but then you may proceed to your regular diet.  Drink plenty of fluids but you should avoid alcoholic beverages for 24 hours.  MEDICATIONS: Continue present medications.  Handouts given to patient: Diverticulosis, Hemorrhoids.  FOLLOW UP:  Repeat colonoscopy in  10 years for screening purposes. Follow up In Dr. Vivia Ewing office as needed.  Thank you for allowing [place] to provide for your healthcare needs today.  ACTIVITY:  You should plan to take it easy for the rest of today and you should NOT DRIVE or use heavy machinery until tomorrow (because of the sedation medicines used during the test).    FOLLOW UP: Our staff will call the number listed on your records the next business day following your procedure.  We will call around 7:15- 8:00 am to check on you and address any questions or concerns that you may have regarding the information given to you following your procedure. If we do not reach you, we will leave a message.     If any biopsies were taken you will be contacted by phone or by letter within the next 1-3 weeks.  Please call us at [phone] if you have not heard about the biopsies in 3 weeks.    SIGNATURES/CONFIDENTIALITY: You and/or your care partner have signed paperwork which will be entered into your electronic medical record.  These signatures attest to the fact that that the information above on your After Visit Summary has been reviewed and is understood.  Full responsibility of the confidentiality of this discharge information lies with you and/or your care-partner.

## 2022-11-12 NOTE — Progress Notes (Signed)
GASTROENTEROLOGY PROCEDURE H&P NOTE   Primary Care Physician: Deland Pretty, MD    Reason for Procedure:  Colon Cancer screening  Plan:    Colonoscopy  Patient is appropriate for endoscopic procedure(s) in the ambulatory ([facility]) setting.  The nature of the procedure, as well as the risks, benefits, and alternatives were carefully and thoroughly reviewed with the patient. Ample time for discussion and questions allowed. The patient understood, was satisfied, and agreed to proceed.     HPI: Emily Ferguson is a 57 y.o. female who presents for colonoscopy for routine Colon Cancer screening.  No active GI symptoms.  No known family history of colon cancer or related malignancy.  Patient is otherwise without complaints or active issues today.  Past Medical History:  Diagnosis Date   Allergy    seasonal   Anxiety    Arthritis    Asthma    Cancer ([place])    basal cell    Depression    GERD (gastroesophageal reflux disease)    Hyperlipidemia     Past Surgical History:  Procedure Laterality Date   CESAREAN SECTION     HYSTEROSCOPY     ovarian septum      Prior to Admission medications   Medication Sig Start Date End Date Taking? Authorizing Provider  ALBUTEROL IN Inhale into the lungs as needed.   Yes [provider]  AZELASTINE & FLUTICASONE NA Place into the nose 2 (two) times daily.   Yes [provider]  flurbiprofen (ANSAID) 100 MG tablet as needed. 07/31/16  Yes [provider]  levocetirizine (XYZAL) 5 MG tablet Take 5 mg by mouth every evening.   Yes [provider]  pravastatin (PRAVACHOL) 20 MG tablet Take 20 mg by mouth daily.   Yes [provider]  Calcium Carb-Cholecalciferol (CALCIUM + D3 PO) Take 1 tablet by mouth daily.    [provider]    Current Outpatient Medications  Medication Sig Dispense Refill   ALBUTEROL IN Inhale into the lungs as needed.     AZELASTINE & FLUTICASONE NA Place into the  nose 2 (two) times daily.     flurbiprofen (ANSAID) 100 MG tablet as needed.     levocetirizine (XYZAL) 5 MG tablet Take 5 mg by mouth every evening.     pravastatin (PRAVACHOL) 20 MG tablet Take 20 mg by mouth daily.     Calcium Carb-Cholecalciferol (CALCIUM + D3 PO) Take 1 tablet by mouth daily.     Current Facility-Administered Medications  Medication Dose Route Frequency Provider Last Rate Last Admin   0.9 %  sodium chloride infusion  500 mL Intravenous Once Rosemary Mossbarger V, DO        Allergies as of 11/12/2022 - Review Complete 11/12/2022  Allergen Reaction Noted   Latex Rash 07/27/2019    Family History  Problem Relation Age of Onset   Colon cancer Neg Hx    Heart disease Neg Hx    Esophageal cancer Neg Hx    Rectal cancer Neg Hx    Stomach cancer Neg Hx    Breast cancer Neg Hx    Colon polyps Neg Hx    Crohn's disease Neg Hx    Ulcerative colitis Neg Hx     Social History   Socioeconomic History   Marital status: Married    Spouse name: Not on file   Number of children: Not on file   Years of education: Not on file   Highest education level: Not on  file  Occupational History   Not on file  Tobacco Use   Smoking status: Never   Smokeless tobacco: Never  Vaping Use   Vaping Use: Never used  Substance and Sexual Activity   Alcohol use: No   Drug use: Never   Sexual activity: Not on file  Other Topics Concern   Not on file  Social History Narrative   Not on file   Social Determinants of Health   Financial Resource Strain: Not on file  Food Insecurity: Not on file  Transportation Needs: Not on file  Physical Activity: Not on file  Stress: Not on file  Social Connections: Not on file  Intimate Partner Violence: Not on file    Physical Exam: Vital signs in last 24 hours: '@BP'$  122/69 (BP Location: Right Arm, Patient Position: Sitting, Cuff Size: Normal)   Pulse (!) 57   Temp (!) 96.2 F (35.7 C) (Temporal)   Ht '5\' 7"'$  (1.702 m)   Wt 138 lb (62.6  kg)   SpO2 100%   BMI 21.61 kg/m  GEN: NAD EYE: Sclerae anicteric ENT: MMM CV: Non-tachycardic Pulm: CTA b/l GI: Soft, NT/ND NEURO:  Alert & Oriented x [place], DO [facility]   11/12/2022 11:44 AM

## 2022-11-13 ENCOUNTER — Telehealth: Payer: Self-pay

## 2022-11-13 NOTE — Telephone Encounter (Signed)
  Follow up Call-     11/12/2022   10:50 AM  Call back number  Post procedure Call Back phone  # [phone]  Permission to leave phone message Yes     Patient questions:  Do you have a fever, pain , or abdominal swelling? No. Pain Score  0 *  Have you tolerated food without any problems? Yes.    Have you been able to return to your normal activities? Yes.    Do you have any questions about your discharge instructions: Diet   No. Medications  No. Follow up visit  No.  Do you have questions or concerns about your Care? No.  Actions: * If pain score is 4 or above: No action needed, pain <4.

## 2023-03-10 ENCOUNTER — Other Ambulatory Visit (INDEPENDENT_AMBULATORY_CARE_PROVIDER_SITE_OTHER): Payer: Commercial Managed Care - PPO

## 2023-03-10 ENCOUNTER — Encounter: Payer: Self-pay | Admitting: Surgical

## 2023-03-10 ENCOUNTER — Ambulatory Visit: Payer: Commercial Managed Care - PPO | Admitting: Surgical

## 2023-03-10 DIAGNOSIS — M79672 Pain in left foot: Secondary | ICD-10-CM

## 2023-03-10 DIAGNOSIS — M79671 Pain in right foot: Secondary | ICD-10-CM

## 2023-03-14 ENCOUNTER — Encounter: Payer: Self-pay | Admitting: Surgical

## 2023-03-14 NOTE — Progress Notes (Signed)
Office Visit Note   Patient: Emily Ferguson           Date of Birth: [birth year]           MRN: [id number] Visit Date: 03/10/2023 Requested by: Merri Brunette, MD [address] [place],  [place] [address] PCP: Merri Brunette, MD  Subjective: Chief Complaint  Patient presents with   Left Heel - Pain   Right Heel - Pain    HPI: Emily Ferguson is a 57 y.o. female who presents to the office reporting bilateral heel pain.  She states that she has had about a week of heel pain in both feet.  She normally does a lot of walking generally walking about 25 to 30 miles per week.  However, she has recently done a charity walk for about 13 miles and then the following day she ended up going to [place] where she was walking about 10 miles per day.  Since she got back about a week ago, she has had onset of moderate heel pain.  Taking Tylenol and occasional ibuprofen for the symptoms.  No numbness or tingling.  No clicking or mechanical symptoms.  She has not tried any topical medicine.  Right foot bothering her more than her left foot.  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  No radiation of pain.  No recent antibiotic use..                ROS: All systems reviewed are negative as they relate to the chief complaint within the history of present illness.  Patient denies fevers or chills.  Assessment & Plan: Visit Diagnoses:  1. Heel pain, bilateral     Plan: Impression is noninsertional Achilles tendinitis from overuse from a long charity walk and her [place] trip.  Recommended that she limit her walking to about 1 mile max per day recreationally instead of the 4 to 5 miles that she normally does.  Recommended trying topical Voltaren over the area of maximal tenderness over the Achilles tendon to see if this will help.  Avoid any explosive movements, especially when she is coaching softball, to prevent Achilles tendon rupture.  Follow-up in 4 weeks for clinical  recheck with Dr. August Saucer.  She may do some light, gentle stretching exercises which were demonstrated for her in the office today.  Follow-Up Instructions: No follow-ups on file.   Orders:  Orders Placed This Encounter  Procedures   XR Os Calcis Right   XR Os Calcis Left   No orders of the defined types were placed in this encounter.     Procedures: No procedures performed   Clinical Data: No additional findings.  Objective: Vital Signs: There were no vitals taken for this visit.  Physical Exam:  Constitutional: Patient appears well-developed HEENT:  Head: Normocephalic Eyes:EOM are normal Neck: Normal range of motion Cardiovascular: Normal rate Pulmonary/chest: Effort normal Neurologic: Patient is alert Skin: Skin is warm Psychiatric: Patient has normal mood and affect  Ortho Exam: Ortho exam demonstrates bilateral heels with Achilles tendon palpable and intact.  There is no tenderness over the Achilles tendon insertion bilaterally but she does have moderate tenderness in the Achilles tendon itself in the watershed region.  Intact dorsiflexion, plantarflexion, inversion, eversion.  Palpable anterior tibialis and posterior tibialis tendons.  No tenderness over the calcaneus itself bilaterally.  No pain with subtalar range of motion bilaterally.  There is no deformity noted or any ecchymosis noted to the  either foot.  Negative Thompson sign.  Excellent plantarflexion strength.  Heel cord not excessively tight in either foot  Specialty Comments:  No specialty comments available.  Imaging: No results found.   PMFS History: Patient Active Problem List   Diagnosis Date Noted   Rhinitis, chronic 10/14/2022   DOE (dyspnea on exertion) 08/06/2022   Left hip pain 04/08/2014   Left knee pain 04/08/2014   Low back pain 04/08/2014   Past Medical History:  Diagnosis Date   Allergy    seasonal   Anxiety    Arthritis    Asthma    Cancer ([facility])    basal cell    Depression     GERD (gastroesophageal reflux disease)    Hyperlipidemia     Family History  Problem Relation Age of Onset   Colon cancer Neg Hx    Heart disease Neg Hx    Esophageal cancer Neg Hx    Rectal cancer Neg Hx    Stomach cancer Neg Hx    Breast cancer Neg Hx    Colon polyps Neg Hx    Crohn's disease Neg Hx    Ulcerative colitis Neg Hx     Past Surgical History:  Procedure Laterality Date   CESAREAN SECTION     HYSTEROSCOPY     ovarian septum     Social History   Occupational History   Not on file  Tobacco Use   Smoking status: Never   Smokeless tobacco: Never  Vaping Use   Vaping Use: Never used  Substance and Sexual Activity   Alcohol use: No   Drug use: Never   Sexual activity: Not on file

## 2023-03-26 ENCOUNTER — Other Ambulatory Visit: Payer: Self-pay

## 2023-03-26 ENCOUNTER — Encounter (HOSPITAL_BASED_OUTPATIENT_CLINIC_OR_DEPARTMENT_OTHER): Payer: Self-pay

## 2023-03-26 ENCOUNTER — Emergency Department (HOSPITAL_BASED_OUTPATIENT_CLINIC_OR_DEPARTMENT_OTHER)
Admission: EM | Admit: 2023-03-26 | Discharge: 2023-03-26 | Disposition: A | Discharge status: Home / Self Care | Payer: Commercial Managed Care - PPO | Attending: Emergency Medicine | Admitting: Emergency Medicine

## 2023-03-26 DIAGNOSIS — X58XXXA Exposure to other specified factors, initial encounter: Secondary | ICD-10-CM | POA: Insufficient documentation

## 2023-03-26 DIAGNOSIS — S0501XA Injury of conjunctiva and corneal abrasion without foreign body, right eye, initial encounter: Secondary | ICD-10-CM | POA: Diagnosis not present

## 2023-03-26 DIAGNOSIS — H43391 Other vitreous opacities, right eye: Secondary | ICD-10-CM

## 2023-03-26 DIAGNOSIS — H5789 Other specified disorders of eye and adnexa: Secondary | ICD-10-CM | POA: Diagnosis present

## 2023-03-26 DIAGNOSIS — Z91040 Latex allergy status: Secondary | ICD-10-CM | POA: Diagnosis not present

## 2023-03-26 HISTORY — DX: Other vitreous opacities, right eye: H43.391

## 2023-03-26 MED ORDER — TETRACAINE HCL 0.5 % OP SOLN
2.0000 [drp] | Freq: Once | OPHTHALMIC | Status: AC
  Administered 2023-03-26: 2 [drp] via OPHTHALMIC
  Filled 2023-03-26: qty 4

## 2023-03-26 MED ORDER — CIPROFLOXACIN HCL 0.3 % OP SOLN: OPHTHALMIC | 0 refills | Status: DC

## 2023-03-26 MED ORDER — FLUORESCEIN SODIUM 1 MG OP STRP
1.0000 | ORAL_STRIP | Freq: Once | OPHTHALMIC | Status: AC
  Administered 2023-03-26: 1 via OPHTHALMIC
  Filled 2023-03-26: qty 1

## 2023-03-26 NOTE — Discharge Instructions (Addendum)
Call the eye doctor today and see when they can see you in the office.

## 2023-03-26 NOTE — ED Provider Notes (Signed)
[facility] AT [facility] Provider Note   CSN: [id number] Arrival date & time: 03/26/23  1014     History  Chief Complaint  Patient presents with   Eye Problem    Emily Ferguson is a 57 y.o. female.  57 yo F with a chief complaint of flashes and floaters to the right eye.  Going on for the better part of a week or 2.  Started feeling like it got more hazy over the past few days.  She called her family doctor who encouraged her to come to the ED for evaluation.  She denies any trauma to the eye but later stated that she felt she had a cut to the lower eyelid about a week ago.  She was contact lenses but has had them out for about 3 to 4 days.  Has had flashes and floaters in the past but they had resolved spontaneously on their own.   Eye Problem      Home Medications Prior to Admission medications   Medication Sig Start Date End Date Taking? Authorizing Provider  ciprofloxacin (CILOXAN) 0.3 % ophthalmic solution Administer 1 drop, every 2 hours, while awake, for 2 days. Then 1 drop, every 4 hours, while awake, for the next 5 days. 03/26/23  Yes Melene Plan, DO  ALBUTEROL IN Inhale into the lungs as needed.    [provider]  AZELASTINE & FLUTICASONE NA Place into the nose 2 (two) times daily.    [provider]  Calcium Carb-Cholecalciferol (CALCIUM + D3 PO) Take 1 tablet by mouth daily.    [provider]  flurbiprofen (ANSAID) 100 MG tablet as needed. 07/31/16   [provider]  levocetirizine (XYZAL) 5 MG tablet Take 5 mg by mouth every evening.    [provider]  pravastatin (PRAVACHOL) 20 MG tablet Take 20 mg by mouth daily.    [provider]      Allergies    Latex    Review of Systems   Review of Systems  Physical Exam Updated Vital Signs BP (!) 142/90 (BP Location: Right Arm)   Pulse (!) 57   Temp 98.5 F (36.9 C) (Oral)   Resp 18   LMP  (LMP Unknown)   SpO2 100%   Physical Exam Vitals and nursing note reviewed.  Constitutional:      General: She is not in acute distress.    Appearance: She is well-developed. She is not diaphoretic.  HENT:     Head: Normocephalic and atraumatic.  Eyes:     Extraocular Movements: Extraocular movements intact.     Conjunctiva/sclera: Conjunctivae normal.     Pupils: Pupils are equal, round, and reactive to light.     Comments: Linear corneal abrasion below of the iris.  Extraocular motion intact.  No corneal injection.  Pupils equal round reactive to light and accommodation.  Intraocular pressure of 15.  Cardiovascular:     Rate and Rhythm: Normal rate and regular rhythm.     Heart sounds: No murmur heard.    No friction rub. No gallop.  Pulmonary:     Effort: Pulmonary effort is normal.     Breath sounds: No wheezing or rales.  Abdominal:     General: There is no distension.     Palpations: Abdomen is soft.     Tenderness: There is no abdominal tenderness.  Musculoskeletal:        General: No tenderness.     Cervical back: Normal  range of motion and neck supple.  Skin:    General: Skin is warm and dry.  Neurological:     Mental Status: She is alert and oriented to person, place, and time.  Psychiatric:        Behavior: Behavior normal.     ED Results / Procedures / Treatments   Labs (all labs ordered are listed, but only abnormal results are displayed) Labs Reviewed - No data to display  EKG None  Radiology No results found.  Procedures Procedures    Medications Ordered in ED Medications  fluorescein ophthalmic strip 1 strip (1 strip Both Eyes Given 03/26/23 1114)  tetracaine (PONTOCAINE) 0.5 % ophthalmic solution 2 drop (2 drops Both Eyes Given 03/26/23 1114)    ED Course/ Medical Decision Making/ A&P                             Medical Decision Making Risk Prescription drug management.   57 yo F with a chief complaints of right eye flashes floaters send then feeling like  something came down over her eye and has been more cloudy.  Concerning for posterior vitreous detachment.  No signs of acute angle-closure glaucoma.  Does have a corneal abrasion will start on topical antibiotics.  Given ophthalmology follow-up.  12:28 PM:  I have discussed the diagnosis/risks/treatment options with the patient.  Evaluation and diagnostic testing in the emergency department does not suggest an emergent condition requiring admission or immediate intervention beyond what has been performed at this time.  They will follow up with Optho. We also discussed returning to the ED immediately if new or worsening sx occur. We discussed the sx which are most concerning (e.g., sudden worsening pain, fever, inability to tolerate by mouth) that necessitate immediate return. Medications administered to the patient during their visit and any new prescriptions provided to the patient are listed below.  Medications given during this visit Medications  fluorescein ophthalmic strip 1 strip (1 strip Both Eyes Given 03/26/23 1114)  tetracaine (PONTOCAINE) 0.5 % ophthalmic solution 2 drop (2 drops Both Eyes Given 03/26/23 1114)     The patient appears reasonably screen and/or stabilized for discharge and I doubt any other medical condition or other [facility] requiring further screening, evaluation, or treatment in the ED at this time prior to discharge.          Final Clinical Impression(s) / ED Diagnoses Final diagnoses:  Floaters in visual field, right  Abrasion of right cornea, initial encounter    Rx / DC Orders ED Discharge Orders          Ordered    ciprofloxacin (CILOXAN) 0.3 % ophthalmic solution        03/26/23 1143              Melene Plan, DO 03/26/23 1228

## 2023-03-26 NOTE — ED Triage Notes (Signed)
Over the pst few days, pt. C/o "floaters, and my overall vision is becoming 'cloudy'". She is in no distress. She tells me she has a remote hx of seeing ophthalmology for right eye floaters. I see no observable abnormalities with her corneas.

## 2023-04-04 ENCOUNTER — Ambulatory Visit: Payer: Commercial Managed Care - PPO | Admitting: Orthopedic Surgery

## 2023-04-24 ENCOUNTER — Other Ambulatory Visit (HOSPITAL_COMMUNITY): Payer: Self-pay | Admitting: Registered Nurse

## 2023-04-24 DIAGNOSIS — E7800 Pure hypercholesterolemia, unspecified: Secondary | ICD-10-CM

## 2023-04-29 ENCOUNTER — Ambulatory Visit (HOSPITAL_COMMUNITY)
Admission: RE | Admit: 2023-04-29 | Discharge: 2023-04-29 | Disposition: A | Discharge status: Home / Self Care | Payer: Commercial Managed Care - PPO | Source: Ambulatory Visit | Attending: Registered Nurse | Admitting: Registered Nurse

## 2023-04-29 DIAGNOSIS — E7800 Pure hypercholesterolemia, unspecified: Secondary | ICD-10-CM | POA: Insufficient documentation

## 2023-08-04 ENCOUNTER — Other Ambulatory Visit: Payer: Self-pay | Admitting: Obstetrics

## 2023-08-04 DIAGNOSIS — Z1231 Encounter for screening mammogram for malignant neoplasm of breast: Secondary | ICD-10-CM

## 2023-09-16 ENCOUNTER — Other Ambulatory Visit: Payer: Self-pay | Admitting: Medical Genetics

## 2023-09-18 ENCOUNTER — Ambulatory Visit
Admission: RE | Admit: 2023-09-18 | Discharge: 2023-09-18 | Disposition: A | Discharge status: Home / Self Care | Payer: Commercial Managed Care - PPO | Source: Ambulatory Visit | Attending: Obstetrics | Admitting: Obstetrics

## 2023-09-18 DIAGNOSIS — Z1231 Encounter for screening mammogram for malignant neoplasm of breast: Secondary | ICD-10-CM

## 2023-10-01 ENCOUNTER — Other Ambulatory Visit: Payer: Self-pay | Admitting: Obstetrics

## 2023-10-01 DIAGNOSIS — Z13820 Encounter for screening for osteoporosis: Secondary | ICD-10-CM

## 2023-10-11 ENCOUNTER — Ambulatory Visit
Admission: RE | Admit: 2023-10-11 | Discharge: 2023-10-11 | Disposition: A | Discharge status: Home / Self Care | Payer: Commercial Managed Care - PPO | Source: Ambulatory Visit | Attending: Obstetrics | Admitting: Obstetrics

## 2023-10-11 DIAGNOSIS — Z13820 Encounter for screening for osteoporosis: Secondary | ICD-10-CM

## 2023-10-21 ENCOUNTER — Other Ambulatory Visit (HOSPITAL_COMMUNITY): Payer: Self-pay

## 2023-10-29 ENCOUNTER — Other Ambulatory Visit (HOSPITAL_COMMUNITY): Payer: Self-pay

## 2023-11-03 ENCOUNTER — Other Ambulatory Visit (HOSPITAL_COMMUNITY)
Admission: RE | Admit: 2023-11-03 | Discharge: 2023-11-03 | Disposition: A | Discharge status: Home / Self Care | Payer: Self-pay | Source: Ambulatory Visit | Attending: Oncology | Admitting: Oncology

## 2023-11-19 LAB — GENECONNECT MOLECULAR SCREEN: Genetic Analysis Overall Interpretation: NEGATIVE

## 2023-12-03 ENCOUNTER — Ambulatory Visit: Payer: Commercial Managed Care - PPO | Attending: Cardiovascular Disease | Admitting: Cardiovascular Disease

## 2023-12-03 ENCOUNTER — Encounter: Payer: Self-pay | Admitting: Cardiovascular Disease

## 2023-12-03 VITALS — BP 130/76 | HR 62 | Ht 67.0 in | Wt 147.2 lb

## 2023-12-03 DIAGNOSIS — E785 Hyperlipidemia, unspecified: Secondary | ICD-10-CM | POA: Insufficient documentation

## 2023-12-03 DIAGNOSIS — E782 Mixed hyperlipidemia: Secondary | ICD-10-CM | POA: Diagnosis not present

## 2023-12-03 DIAGNOSIS — R0600 Dyspnea, unspecified: Secondary | ICD-10-CM | POA: Diagnosis not present

## 2023-12-03 DIAGNOSIS — R0789 Other chest pain: Secondary | ICD-10-CM | POA: Diagnosis not present

## 2023-12-03 DIAGNOSIS — R0609 Other forms of dyspnea: Secondary | ICD-10-CM

## 2023-12-03 MED ORDER — ROSUVASTATIN CALCIUM 5 MG PO TABS: 5.0000 mg | ORAL_TABLET | ORAL | 3 refills | Status: DC

## 2023-12-03 NOTE — Patient Instructions (Signed)
 Medication Instructions:  Your physician has recommended you make the following change in your medication:   -Start rosuvastatin (crestor) 5mg  every other day in the evening.  *If you need a refill on your cardiac medications before your next appointment, please call your pharmacy*   Lab Work: Your physician recommends that you return for lab work in: 3 months for FASTING lipid/liver panel  If you have labs (blood work) drawn today and your tests are completely normal, you will receive your results only by: MyChart Message (if you have MyChart) OR A paper copy in the mail If you have any lab test that is abnormal or we need to change your treatment, we will call you to review the results.   Testing/Procedures: Your physician has requested that you have an echocardiogram. Echocardiography is a painless test that uses sound waves to create images of your heart. It provides your doctor with information about the size and shape of your heart and how well your heart's chambers and valves are working. This procedure takes approximately one hour. There are no restrictions for this procedure. Please do NOT wear cologne, perfume, aftershave, or lotions (deodorant is allowed). Please arrive 15 minutes prior to your appointment time.  Please note: We ask at that you not bring children with you during ultrasound (echo/ vascular) testing. Due to room size and safety concerns, children are not allowed in the ultrasound rooms during exams. Our front office staff cannot provide observation of children in our lobby area while testing is being conducted. An adult accompanying a patient to their appointment will only be allowed in the ultrasound room at the discretion of the ultrasound technician under special circumstances. We apologize for any inconvenience.    Follow-Up: At [facility], you and your health needs are our priority.  As part of our continuing mission to provide you with exceptional  heart care, we have created designated Provider Care Teams.  These Care Teams include your primary Cardiologist (physician) and Advanced Practice Providers (APPs -  Physician Assistants and Nurse Practitioners) who all work together to provide you with the care you need, when you need it.  We recommend signing up for the patient portal called "MyChart".  Sign up information is provided on this After Visit Summary.  MyChart is used to connect with patients for Virtual Visits (Telemedicine).  Patients are able to view lab/test results, encounter notes, upcoming appointments, etc.  Non-urgent messages can be sent to your provider as well.   To learn more about what you can do with MyChart, go to [email or web address].    Your next appointment:   6 month(s)  Provider:   Nanetta Batty, MD    Other Instructions   1st Floor: - Lobby - Registration  - Pharmacy  - Lab - Cafe  2nd Floor: - PV Lab - Diagnostic Testing (echo, CT, nuclear med)  3rd Floor: - Vacant  4th Floor: - TCTS (cardiothoracic surgery) - AFib Clinic - Structural Heart Clinic - Vascular Surgery  - Vascular Ultrasound  5th Floor: - [facility] Card[facility] (general and EP) - Clinical Pharmacy for coumadin, hypertension, lipid, weight-loss medications, and med management appointments    Valet parking services will be available as well.

## 2023-12-03 NOTE — Assessment & Plan Note (Signed)
 History of dyspnea on exertion for the last several years.  She has been evaluated by a pulmonologist and an ENT doctor both of whom did not feel that her shortness of breath was related to their fields.  She has never smoked.  She did have a coronary calcium score performed 04/29/2023 which was 0.  I am going to get a 2D echocardiogram to further evaluate.

## 2023-12-03 NOTE — Progress Notes (Signed)
 12/03/2023 Emily Ferguson   04/11/1966  [id number]  Primary Physician Merri Brunette, MD Primary Cardiologist: Runell Gess MD Nicholes Calamity, [place]  HPI:  Emily Ferguson is a 58 y.o. thin and fit appearing married Caucasian female mother of 1 child who is husband Cedric Fishman is a patient of mine as well.  She is referred by Lauretta Chester NP in  Dr. Carolee Rota office for evaluation of dyspnea.  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  She has no risk factors other than untreated hyperlipidemia and is statin intolerant (pravastatin remotely).  She is fairly active and walks 5 miles a day.  She is concerned about her mild dyspnea.  She did have a coronary calcium score performed 04/29/2023 which was 0.   Current Meds  Medication Sig   acetaminophen (TYLENOL) 650 MG CR tablet Take 650 mg by mouth as needed.   albuterol (VENTOLIN HFA) 108 (90 Base) MCG/ACT inhaler Inhale into the lungs as needed.   ALBUTEROL IN Inhale into the lungs as needed.   Azelastine HCl 137 MCG/SPRAY SOLN Place 2 sprays into both nostrils daily.   Calcium Carb-Cholecalciferol (CALCIUM + D3 PO) Take 1 tablet by mouth daily.   estradiol (ESTRACE) 0.1 MG/GM vaginal cream Place 1 Applicatorful vaginally 2 (two) times a week.   flurbiprofen (ANSAID) 100 MG tablet as needed.   fluticasone (FLONASE) 50 MCG/ACT nasal spray Place 2 sprays into both nostrils daily.   levocetirizine (XYZAL) 5 MG tablet Take 5 mg by mouth every evening.   [DISCONTINUED] AZELASTINE & FLUTICASONE NA Place into the nose 2 (two) times daily.   Current Facility-Administered Medications for the 12/03/23 encounter (Office Visit) with Runell Gess, MD  Medication   0.9 %  sodium chloride infusion     Allergies  Allergen Reactions   Latex Rash    Social History   Socioeconomic History   Marital status: Married    Spouse name: Not on file   Number of children: Not  on file   Years of education: Not on file   Highest education level: Not on file  Occupational History   Not on file  Tobacco Use   Smoking status: Never   Smokeless tobacco: Never  Vaping Use   Vaping status: Never Used  Substance and Sexual Activity   Alcohol use: No   Drug use: Never   Sexual activity: Not on file  Other Topics Concern   Not on file  Social History Narrative   Not on file   Social Drivers of Health   Financial Resource Strain: Not on file  Food Insecurity: Low Risk  (07/31/2023)   Received from [facility]   Hunger Vital Sign    Worried About Running Out of Food in the Last Year: Never true    Ran Out of Food in the Last Year: Never true  Transportation Needs: No Transportation Needs (07/31/2023)   Received from [employer]    In the past 12 months, has lack of reliable transportation kept you from medical appointments, meetings, work or from getting things needed for daily living? : No  Physical Activity: Not on file  Stress: Not on file  Social Connections: Not on file  Intimate Partner Violence: Not on file     Review of Systems: General: negative for chills, fever, night sweats or weight changes.  Cardiovascular: negative for chest pain, dyspnea on exertion,  edema, orthopnea, palpitations, paroxysmal nocturnal dyspnea or shortness of breath Dermatological: negative for rash Respiratory: negative for cough or wheezing Urologic: negative for hematuria Abdominal: negative for nausea, vomiting, diarrhea, bright red blood per rectum, melena, or hematemesis Neurologic: negative for visual changes, syncope, or dizziness All other systems reviewed and are otherwise negative except as noted above.    Blood pressure 130/76, pulse 62, height 5\' 7"  (1.702 m), weight 147 lb 3.2 oz (66.8 kg), SpO2 99%.  General appearance: alert and no distress Neck: no adenopathy, no carotid bruit, no JVD, supple, symmetrical, trachea midline, and  thyroid not enlarged, symmetric, no tenderness/mass/nodules Lungs: clear to auscultation bilaterally Heart: regular rate and rhythm, S1, S2 normal, no murmur, click, rub or gallop Extremities: extremities normal, atraumatic, no cyanosis or edema Pulses: 2+ and symmetric Skin: Skin color, texture, turgor normal. No rashes or lesions Neurologic: Grossly normal  EKG EKG Interpretation Date/Time:  Wednesday December 03 2023 13:44:56 EDT Ventricular Rate:  62 PR Interval:  132 QRS Duration:  90 QT Interval:  430 QTC Calculation: 436 R Axis:   82  Text Interpretation: Normal sinus rhythm Normal ECG No previous ECGs available Confirmed by Nanetta Batty [phone]) on 12/03/2023 1:51:50 PM    ASSESSMENT AND PLAN:   DOE (dyspnea on exertion) History of dyspnea on exertion for the last several years.  She has been evaluated by a pulmonologist and an ENT doctor both of whom did not feel that her shortness of breath was related to their fields.  She has never smoked.  She did have a coronary calcium score performed 04/29/2023 which was 0.  I am going to get a 2D echocardiogram to further evaluate.  Hyperlipidemia History of hyperlipidemia intolerant to statin therapy.  She had tried pravastatin in the past.  Her most recent lipid profile performed by her PCP 09/24/2023 revealed total cholesterol of 243, LDL 166 and HDL 55.  She does have a coronary calcium score of 0 and therefore her LDL goal should be less than 100.  I am going to rechallenge her with rosuvastatin 5 mg every other day and we will recheck an FLP in 3 months.     Runell Gess MD FACP,FACC,FAHA, [facility] 12/03/2023 2:04 PM

## 2023-12-03 NOTE — Assessment & Plan Note (Signed)
 History of hyperlipidemia intolerant to statin therapy.  She had tried pravastatin in the past.  Her most recent lipid profile performed by her PCP 09/24/2023 revealed total cholesterol of 243, LDL 166 and HDL 55.  She does have a coronary calcium score of 0 and therefore her LDL goal should be less than 100.  I am going to rechallenge her with rosuvastatin 5 mg every other day and we will recheck an FLP in 3 months.

## 2023-12-10 ENCOUNTER — Ambulatory Visit (HOSPITAL_COMMUNITY): Attending: Cardiology

## 2023-12-10 DIAGNOSIS — R0609 Other forms of dyspnea: Secondary | ICD-10-CM | POA: Diagnosis present

## 2023-12-10 DIAGNOSIS — E782 Mixed hyperlipidemia: Secondary | ICD-10-CM | POA: Insufficient documentation

## 2023-12-10 DIAGNOSIS — R0789 Other chest pain: Secondary | ICD-10-CM | POA: Insufficient documentation

## 2023-12-10 LAB — ECHOCARDIOGRAM COMPLETE
Area-P 1/2: 3.17 cm2
S' Lateral: 2.5 cm

## 2024-04-21 ENCOUNTER — Ambulatory Visit: Payer: Self-pay | Admitting: Cardiovascular Disease

## 2024-04-21 LAB — HEPATIC FUNCTION PANEL
ALT: 17 IU/L (ref 0–32)
AST: 22 IU/L (ref 0–40)
Albumin: 4.4 g/dL (ref 3.8–4.9)
Alkaline Phosphatase: 121 IU/L (ref 44–121)
Bilirubin Total: 0.5 mg/dL (ref 0.0–1.2)
Bilirubin, Direct: 0.16 mg/dL (ref 0.00–0.40)
Total Protein: 6.7 g/dL (ref 6.0–8.5)

## 2024-04-21 LAB — LIPID PANEL
Chol/HDL Ratio: 3 ratio (ref 0.0–4.4)
Cholesterol, Total: 184 mg/dL (ref 100–199)
HDL: 61 mg/dL (ref >39)
LDL Chol Calc (NIH): 111 mg/dL — ABNORMAL HIGH (ref 0–99)
Triglycerides: 66 mg/dL (ref 0–149)
VLDL Cholesterol Cal: 12 mg/dL (ref 5–40)

## 2024-06-02 ENCOUNTER — Ambulatory Visit: Admitting: Cardiovascular Disease

## 2024-06-07 ENCOUNTER — Ambulatory Visit: Attending: Cardiovascular Disease | Admitting: Cardiovascular Disease

## 2024-06-07 ENCOUNTER — Encounter: Payer: Self-pay | Admitting: Cardiovascular Disease

## 2024-06-07 VITALS — BP 112/80 | HR 57 | Ht 67.0 in | Wt 150.0 lb

## 2024-06-07 DIAGNOSIS — E782 Mixed hyperlipidemia: Secondary | ICD-10-CM | POA: Diagnosis not present

## 2024-06-07 DIAGNOSIS — R0600 Dyspnea, unspecified: Secondary | ICD-10-CM

## 2024-06-07 DIAGNOSIS — R0609 Other forms of dyspnea: Secondary | ICD-10-CM

## 2024-06-07 MED ORDER — ROSUVASTATIN CALCIUM 5 MG PO TABS: 5.0000 mg | ORAL_TABLET | Freq: Every day | ORAL | 3 refills | Status: AC

## 2024-06-07 NOTE — Patient Instructions (Signed)
 Medication Instructions:  Your physician has recommended you make the following change in your medication:   -Increase Rosuvastatin  (Crestor ) to 5mg  daily.  *If you need a refill on your cardiac medications before your next appointment, please call your pharmacy*  Lab Work: Your physician recommends that you return for lab work in: 3 months for FASTING lipid/liver panel  If you have labs (blood work) drawn today and your tests are completely normal, you will receive your results only by: MyChart Message (if you have MyChart) OR A paper copy in the mail If you have any lab test that is abnormal or we need to change your treatment, we will call you to review the results.   Follow-Up: At [facility], you and your health needs are our priority.  As part of our continuing mission to provide you with exceptional heart care, our providers are all part of one team.  This team includes your primary Cardiologist (physician) and Advanced Practice Providers or APPs (Physician Assistants and Nurse Practitioners) who all work together to provide you with the care you need, when you need it.  Your next appointment:   We will see you on an as needed basis    Provider:   Dorn Lesches, MD

## 2024-06-07 NOTE — Progress Notes (Signed)
 06/07/2024 Emily Ferguson   22-Dec-1965  [id number]  Primary Physician Clarice Nottingham, MD Primary Cardiologist: Dorn JINNY Lesches MD GENI CODY MADE[place], [place]  HPI:  Emily Ferguson is a 58 y.o.   thin and fit appearing married Caucasian female mother of 1 child who is husband Octaviano Sheldon is a patient of mine as well.  She is referred by Ronal Marie NP in  Dr. Coni office for evaluation of dyspnea.  I last saw her in the office 12/03/2023.  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  She has no risk factors other than untreated hyperlipidemia and is statin intolerant (pravastatin remotely).  She is fairly active and walks 5 miles a day.  She is concerned about her mild dyspnea.  She did have a coronary calcium  score performed 04/29/2023 which was 0.  Since I saw her 6 months ago she still is complaining of some dyspnea on exertion.  She is fairly active.  A 2D echo performed/2/25 was essentially normal.   Current Meds  Medication Sig   acetaminophen (TYLENOL) 650 MG CR tablet Take 650 mg by mouth as needed.   albuterol (VENTOLIN HFA) 108 (90 Base) MCG/ACT inhaler Inhale into the lungs as needed.   ALBUTEROL IN Inhale into the lungs as needed.   Azelastine  HCl 137 MCG/SPRAY SOLN Place 2 sprays into both nostrils daily.   Calcium  Carb-Cholecalciferol (CALCIUM  + D3 PO) Take 1 tablet by mouth daily.   estradiol (ESTRACE) 0.1 MG/GM vaginal cream Place 1 Applicatorful vaginally 2 (two) times a week.   flurbiprofen (ANSAID) 100 MG tablet as needed.   fluticasone  (FLONASE) 50 MCG/ACT nasal spray Place 2 sprays into both nostrils daily.   levocetirizine (XYZAL) 5 MG tablet Take 5 mg by mouth every evening.   rosuvastatin  (CRESTOR ) 5 MG tablet Take 1 tablet (5 mg total) by mouth every other day.   Current Facility-Administered Medications for the 06/07/24 encounter (Office Visit) with Lesches Dorn JINNY, MD  Medication   0.9 %   sodium chloride  infusion     Allergies  Allergen Reactions   Latex Rash    Social History   Socioeconomic History   Marital status: Married    Spouse name: Not on file   Number of children: Not on file   Years of education: Not on file   Highest education level: Not on file  Occupational History   Not on file  Tobacco Use   Smoking status: Never   Smokeless tobacco: Never  Vaping Use   Vaping status: Never Used  Substance and Sexual Activity   Alcohol use: No   Drug use: Never   Sexual activity: Not on file  Other Topics Concern   Not on file  Social History Narrative   Not on file   Social Drivers of Health   Financial Resource Strain: Not on file  Food Insecurity: Low Risk  (07/31/2023)   Received from [facility]   Hunger Vital Sign    Within the past 12 months, you worried that your food would run out before you got money to buy more: Never true    Within the past 12 months, the food you bought just didn't last and you didn't have money to get more. : Never true  Transportation Needs: No Transportation Needs (07/31/2023)   Received from [facility]    In the past 12 months, has lack of reliable transportation  kept you from medical appointments, meetings, work or from getting things needed for daily living? : No  Physical Activity: Not on file  Stress: Not on file  Social Connections: Not on file  Intimate Partner Violence: Not on file     Review of Systems: General: negative for chills, fever, night sweats or weight changes.  Cardiovascular: negative for chest pain, dyspnea on exertion, edema, orthopnea, palpitations, paroxysmal nocturnal dyspnea or shortness of breath Dermatological: negative for rash Respiratory: negative for cough or wheezing Urologic: negative for hematuria Abdominal: negative for nausea, vomiting, diarrhea, bright red blood per rectum, melena, or hematemesis Neurologic: negative for visual changes, syncope, or  dizziness All other systems reviewed and are otherwise negative except as noted above.    Blood pressure 112/80, pulse (!) 57, height 5' 7 (1.702 m), weight 150 lb (68 kg), SpO2 97%.  General appearance: alert and no distress Neck: no adenopathy, no carotid bruit, no JVD, supple, symmetrical, trachea midline, and thyroid  not enlarged, symmetric, no tenderness/mass/nodules Lungs: clear to auscultation bilaterally Heart: regular rate and rhythm, S1, S2 normal, no murmur, click, rub or gallop Extremities: extremities normal, atraumatic, no cyanosis or edema Pulses: 2+ and symmetric Skin: Skin color, texture, turgor normal. No rashes or lesions Neurologic: Grossly normal  EKG EKG Interpretation Date/Time:  Monday June 07 2024 10:05:59 EDT Ventricular Rate:  54 PR Interval:  110 QRS Duration:  82 QT Interval:  446 QTC Calculation: 422 R Axis:   56  Text Interpretation: Sinus bradycardia with short PR When compared with ECG of 03-Dec-2023 13:44, No significant change was found Confirmed by Court Carrier [phone]) on 06/07/2024 10:15:30 AM    ASSESSMENT AND PLAN:   DOE (dyspnea on exertion) Patient was referred to initially for dyspnea on exertion.  Her coronary calcium  score was 0 and her 2D echo performed/2/25 was normal as well.  I do not think there is a cardiovascular etiology to her dyspnea and suggested that she pursue a pulmonary evaluation.  Hyperlipidemia History of hyperlipidemia intolerant to pravastatin in the past.  She is on rosuvastatin  5 mg every other day with a recent lipid profile performed 04/20/2024 revealing total cholesterol 184, LDL 111 and HDL of 61.  She is not at goal for primary prevention given her coronary calcium  score 0.  I am going to increase her rosuvastatin  from every other day to daily and we will recheck a lipid liver profile in 3 months, LDL goal less than 100.     Carrier DOROTHA Court MD FACP,FACC,FAHA, [facility] 06/07/2024 10:21 AM

## 2024-06-07 NOTE — Assessment & Plan Note (Signed)
 Patient was referred to initially for dyspnea on exertion.  Her coronary calcium  score was 0 and her 2D echo performed/2/25 was normal as well.  I do not think there is a cardiovascular etiology to her dyspnea and suggested that she pursue a pulmonary evaluation.

## 2024-06-07 NOTE — Assessment & Plan Note (Signed)
 History of hyperlipidemia intolerant to pravastatin in the past.  She is on rosuvastatin  5 mg every other day with a recent lipid profile performed 04/20/2024 revealing total cholesterol 184, LDL 111 and HDL of 61.  She is not at goal for primary prevention given her coronary calcium  score 0.  I am going to increase her rosuvastatin  from every other day to daily and we will recheck a lipid liver profile in 3 months, LDL goal less than 100.

## 2024-09-16 ENCOUNTER — Other Ambulatory Visit: Payer: Self-pay | Admitting: Obstetrics

## 2024-09-16 DIAGNOSIS — Z1231 Encounter for screening mammogram for malignant neoplasm of breast: Secondary | ICD-10-CM

## 2024-09-17 ENCOUNTER — Ambulatory Visit
Admission: RE | Admit: 2024-09-17 | Discharge: 2024-09-17 | Disposition: A | Source: Ambulatory Visit | Attending: Obstetrics | Admitting: Obstetrics

## 2024-09-17 DIAGNOSIS — Z1231 Encounter for screening mammogram for malignant neoplasm of breast: Secondary | ICD-10-CM

## 2024-09-24 LAB — LAB REPORT - SCANNED
EGFR: 83
TSH: 2.07 (ref 0.41–5.90)

## 2024-10-01 ENCOUNTER — Ambulatory Visit: Payer: Self-pay | Admitting: Cardiovascular Disease
